# Patient Record
Sex: Female | Born: 1960 | Hispanic: No | Smoking: Former smoker
Health system: Southern US, Community
[De-identification: ages and names within clinical notes are randomized; demographics above are authoritative.]

## PROBLEM LIST (undated history)

## (undated) DIAGNOSIS — R4182 Altered mental status, unspecified: Secondary | ICD-10-CM

## (undated) DIAGNOSIS — F1010 Alcohol abuse, uncomplicated: Secondary | ICD-10-CM

## (undated) DIAGNOSIS — E109 Type 1 diabetes mellitus without complications: Secondary | ICD-10-CM

## (undated) DIAGNOSIS — R569 Unspecified convulsions: Secondary | ICD-10-CM

## (undated) DIAGNOSIS — F319 Bipolar disorder, unspecified: Secondary | ICD-10-CM

## (undated) DIAGNOSIS — E1110 Type 2 diabetes mellitus with ketoacidosis without coma: Secondary | ICD-10-CM

## (undated) DIAGNOSIS — D649 Anemia, unspecified: Secondary | ICD-10-CM

## (undated) DIAGNOSIS — N179 Acute kidney failure, unspecified: Secondary | ICD-10-CM

## (undated) DIAGNOSIS — Z5189 Encounter for other specified aftercare: Secondary | ICD-10-CM

## (undated) DIAGNOSIS — E1169 Type 2 diabetes mellitus with other specified complication: Secondary | ICD-10-CM

## (undated) DIAGNOSIS — I219 Acute myocardial infarction, unspecified: Secondary | ICD-10-CM

## (undated) HISTORY — PX: TONSILLECTOMY: SUR1361

## (undated) HISTORY — PX: ABDOMINAL HYSTERECTOMY: SHX81

---

## 2000-01-20 ENCOUNTER — Emergency Department (HOSPITAL_COMMUNITY): Admission: EM | Admit: 2000-01-20 | Discharge: 2000-01-20 | Payer: Self-pay | Admitting: Emergency Medicine

## 2000-01-20 ENCOUNTER — Encounter: Payer: Self-pay | Admitting: Emergency Medicine

## 2000-08-13 ENCOUNTER — Emergency Department (HOSPITAL_COMMUNITY): Admission: EM | Admit: 2000-08-13 | Discharge: 2000-08-14 | Payer: Self-pay | Admitting: Emergency Medicine

## 2000-08-16 ENCOUNTER — Emergency Department (HOSPITAL_COMMUNITY): Admission: EM | Admit: 2000-08-16 | Discharge: 2000-08-16 | Payer: Self-pay | Admitting: Emergency Medicine

## 2000-08-16 ENCOUNTER — Encounter: Payer: Self-pay | Admitting: Emergency Medicine

## 2000-09-01 ENCOUNTER — Encounter: Admission: RE | Admit: 2000-09-01 | Discharge: 2000-09-03 | Payer: Self-pay | Admitting: Neurological Surgery

## 2000-12-08 ENCOUNTER — Encounter: Admission: RE | Admit: 2000-12-08 | Discharge: 2001-03-08 | Payer: Self-pay | Admitting: Sports Medicine

## 2001-04-28 ENCOUNTER — Other Ambulatory Visit: Admission: RE | Admit: 2001-04-28 | Discharge: 2001-04-28 | Payer: Self-pay | Admitting: *Deleted

## 2001-05-18 ENCOUNTER — Encounter: Admission: RE | Admit: 2001-05-18 | Discharge: 2001-06-19 | Payer: Self-pay | Admitting: Sports Medicine

## 2015-01-02 DIAGNOSIS — E1169 Type 2 diabetes mellitus with other specified complication: Secondary | ICD-10-CM

## 2015-01-02 DIAGNOSIS — N179 Acute kidney failure, unspecified: Secondary | ICD-10-CM

## 2015-01-02 DIAGNOSIS — E1110 Type 2 diabetes mellitus with ketoacidosis without coma: Secondary | ICD-10-CM

## 2015-01-02 HISTORY — DX: Type 2 diabetes mellitus with other specified complication: E11.69

## 2015-01-02 HISTORY — DX: Type 2 diabetes mellitus with ketoacidosis without coma: E11.10

## 2015-01-02 HISTORY — DX: Acute kidney failure, unspecified: N17.9

## 2015-02-11 ENCOUNTER — Emergency Department (HOSPITAL_COMMUNITY): Payer: BLUE CROSS/BLUE SHIELD

## 2015-02-11 ENCOUNTER — Encounter (HOSPITAL_COMMUNITY): Payer: Self-pay

## 2015-02-11 ENCOUNTER — Inpatient Hospital Stay (HOSPITAL_COMMUNITY)
Admission: EM | Admit: 2015-02-11 | Discharge: 2015-02-16 | DRG: 637 | Disposition: A | Discharge status: Home / Self Care | Payer: BLUE CROSS/BLUE SHIELD | Attending: Internal Medicine | Admitting: Internal Medicine

## 2015-02-11 DIAGNOSIS — Z87891 Personal history of nicotine dependence: Secondary | ICD-10-CM | POA: Diagnosis not present

## 2015-02-11 DIAGNOSIS — I252 Old myocardial infarction: Secondary | ICD-10-CM

## 2015-02-11 DIAGNOSIS — F1021 Alcohol dependence, in remission: Secondary | ICD-10-CM | POA: Diagnosis present

## 2015-02-11 DIAGNOSIS — D631 Anemia in chronic kidney disease: Secondary | ICD-10-CM | POA: Diagnosis present

## 2015-02-11 DIAGNOSIS — E875 Hyperkalemia: Secondary | ICD-10-CM | POA: Diagnosis present

## 2015-02-11 DIAGNOSIS — F319 Bipolar disorder, unspecified: Secondary | ICD-10-CM | POA: Diagnosis present

## 2015-02-11 DIAGNOSIS — Z833 Family history of diabetes mellitus: Secondary | ICD-10-CM | POA: Diagnosis not present

## 2015-02-11 DIAGNOSIS — Z8701 Personal history of pneumonia (recurrent): Secondary | ICD-10-CM

## 2015-02-11 DIAGNOSIS — D649 Anemia, unspecified: Secondary | ICD-10-CM | POA: Diagnosis present

## 2015-02-11 DIAGNOSIS — E1110 Type 2 diabetes mellitus with ketoacidosis without coma: Secondary | ICD-10-CM | POA: Diagnosis present

## 2015-02-11 DIAGNOSIS — Z79899 Other long term (current) drug therapy: Secondary | ICD-10-CM

## 2015-02-11 DIAGNOSIS — F1011 Alcohol abuse, in remission: Secondary | ICD-10-CM | POA: Diagnosis present

## 2015-02-11 DIAGNOSIS — E871 Hypo-osmolality and hyponatremia: Secondary | ICD-10-CM | POA: Diagnosis present

## 2015-02-11 DIAGNOSIS — E876 Hypokalemia: Secondary | ICD-10-CM | POA: Diagnosis present

## 2015-02-11 DIAGNOSIS — Z794 Long term (current) use of insulin: Secondary | ICD-10-CM | POA: Diagnosis not present

## 2015-02-11 DIAGNOSIS — F1010 Alcohol abuse, uncomplicated: Secondary | ICD-10-CM | POA: Diagnosis not present

## 2015-02-11 DIAGNOSIS — E1040 Type 1 diabetes mellitus with diabetic neuropathy, unspecified: Secondary | ICD-10-CM | POA: Diagnosis present

## 2015-02-11 DIAGNOSIS — D638 Anemia in other chronic diseases classified elsewhere: Secondary | ICD-10-CM | POA: Diagnosis not present

## 2015-02-11 DIAGNOSIS — Z7982 Long term (current) use of aspirin: Secondary | ICD-10-CM | POA: Diagnosis not present

## 2015-02-11 DIAGNOSIS — N189 Chronic kidney disease, unspecified: Secondary | ICD-10-CM | POA: Diagnosis present

## 2015-02-11 DIAGNOSIS — Z8249 Family history of ischemic heart disease and other diseases of the circulatory system: Secondary | ICD-10-CM

## 2015-02-11 DIAGNOSIS — E1140 Type 2 diabetes mellitus with diabetic neuropathy, unspecified: Secondary | ICD-10-CM | POA: Diagnosis present

## 2015-02-11 DIAGNOSIS — N179 Acute kidney failure, unspecified: Secondary | ICD-10-CM | POA: Diagnosis present

## 2015-02-11 DIAGNOSIS — E1310 Other specified diabetes mellitus with ketoacidosis without coma: Secondary | ICD-10-CM | POA: Diagnosis not present

## 2015-02-11 DIAGNOSIS — E1022 Type 1 diabetes mellitus with diabetic chronic kidney disease: Secondary | ICD-10-CM | POA: Diagnosis present

## 2015-02-11 DIAGNOSIS — N170 Acute kidney failure with tubular necrosis: Secondary | ICD-10-CM | POA: Diagnosis present

## 2015-02-11 DIAGNOSIS — R109 Unspecified abdominal pain: Secondary | ICD-10-CM | POA: Diagnosis present

## 2015-02-11 DIAGNOSIS — L8990 Pressure ulcer of unspecified site, unspecified stage: Secondary | ICD-10-CM | POA: Diagnosis present

## 2015-02-11 DIAGNOSIS — J449 Chronic obstructive pulmonary disease, unspecified: Secondary | ICD-10-CM | POA: Diagnosis present

## 2015-02-11 DIAGNOSIS — E10649 Type 1 diabetes mellitus with hypoglycemia without coma: Secondary | ICD-10-CM | POA: Diagnosis present

## 2015-02-11 DIAGNOSIS — I129 Hypertensive chronic kidney disease with stage 1 through stage 4 chronic kidney disease, or unspecified chronic kidney disease: Secondary | ICD-10-CM | POA: Diagnosis present

## 2015-02-11 DIAGNOSIS — I10 Essential (primary) hypertension: Secondary | ICD-10-CM | POA: Diagnosis present

## 2015-02-11 DIAGNOSIS — E1010 Type 1 diabetes mellitus with ketoacidosis without coma: Secondary | ICD-10-CM | POA: Diagnosis present

## 2015-02-11 HISTORY — DX: Acute myocardial infarction, unspecified: I21.9

## 2015-02-11 HISTORY — DX: Encounter for other specified aftercare: Z51.89

## 2015-02-11 LAB — GLUCOSE, CAPILLARY
GLUCOSE-CAPILLARY: 342 mg/dL — AB (ref 65–99)
GLUCOSE-CAPILLARY: 155 mg/dL — AB (ref 65–99)
Glucose-Capillary: 293 mg/dL — ABNORMAL HIGH (ref 65–99)
Glucose-Capillary: 240 mg/dL — ABNORMAL HIGH (ref 65–99)

## 2015-02-11 LAB — COMPREHENSIVE METABOLIC PANEL
ALBUMIN: 3.6 g/dL (ref 3.5–5.0)
ALK PHOS: 122 U/L (ref 38–126)
ALT: 17 U/L (ref 14–54)
AST: 18 U/L (ref 15–41)
Anion gap: 26 — ABNORMAL HIGH (ref 5–15)
BUN: 39 mg/dL — ABNORMAL HIGH (ref 6–20)
CALCIUM: 9.2 mg/dL (ref 8.9–10.3)
CHLORIDE: 97 mmol/L — AB (ref 101–111)
CO2: 11 mmol/L — AB (ref 22–32)
CREATININE: 2.91 mg/dL — AB (ref 0.44–1.00)
GFR calc Af Amer: 20 mL/min — ABNORMAL LOW (ref >60)
GFR calc non Af Amer: 17 mL/min — ABNORMAL LOW (ref >60)
GLUCOSE: 763 mg/dL — AB (ref 65–99)
Potassium: 5.8 mmol/L — ABNORMAL HIGH (ref 3.5–5.1)
SODIUM: 134 mmol/L — AB (ref 135–145)
Total Bilirubin: 1.7 mg/dL — ABNORMAL HIGH (ref 0.3–1.2)
Total Protein: 7.7 g/dL (ref 6.5–8.1)

## 2015-02-11 LAB — CBC WITH DIFFERENTIAL/PLATELET
BASOS ABS: 0.1 10*3/uL (ref 0.0–0.1)
BASOS PCT: 1 % (ref 0–1)
EOS ABS: 0 10*3/uL (ref 0.0–0.7)
Eosinophils Relative: 0 % (ref 0–5)
HEMATOCRIT: 30.9 % — AB (ref 36.0–46.0)
HEMOGLOBIN: 9.6 g/dL — AB (ref 12.0–15.0)
Lymphocytes Relative: 23 % (ref 12–46)
Lymphs Abs: 1.9 10*3/uL (ref 0.7–4.0)
MCH: 30.4 pg (ref 26.0–34.0)
MCHC: 31.1 g/dL (ref 30.0–36.0)
MCV: 97.8 fL (ref 78.0–100.0)
Monocytes Absolute: 0.3 10*3/uL (ref 0.1–1.0)
Monocytes Relative: 3 % (ref 3–12)
NEUTROS ABS: 6.3 10*3/uL (ref 1.7–7.7)
NEUTROS PCT: 73 % (ref 43–77)
Platelets: 359 10*3/uL (ref 150–400)
RBC: 3.16 MIL/uL — AB (ref 3.87–5.11)
RDW: 13.5 % (ref 11.5–15.5)
WBC: 8.6 10*3/uL (ref 4.0–10.5)

## 2015-02-11 LAB — BLOOD GAS, VENOUS
Acid-base deficit: 15.5 mmol/L — ABNORMAL HIGH (ref 0.0–2.0)
BICARBONATE: 10.6 meq/L — AB (ref 20.0–24.0)
O2 Saturation: 62.6 %
PATIENT TEMPERATURE: 98.6
PO2 VEN: 39.6 mmHg (ref 30.0–45.0)
TCO2: 10.3 mmol/L (ref 0–100)
pCO2, Ven: 26.3 mmHg — ABNORMAL LOW (ref 45.0–50.0)
pH, Ven: 7.228 — ABNORMAL LOW (ref 7.250–7.300)

## 2015-02-11 LAB — MRSA PCR SCREENING: MRSA BY PCR: NEGATIVE

## 2015-02-11 LAB — URINALYSIS, ROUTINE W REFLEX MICROSCOPIC
BILIRUBIN URINE: NEGATIVE
HGB URINE DIPSTICK: NEGATIVE
Leukocytes, UA: NEGATIVE
Nitrite: NEGATIVE
PROTEIN: NEGATIVE mg/dL
Specific Gravity, Urine: 1.02 (ref 1.005–1.030)
UROBILINOGEN UA: 0.2 mg/dL (ref 0.0–1.0)
pH: 5 (ref 5.0–8.0)

## 2015-02-11 LAB — BASIC METABOLIC PANEL
ANION GAP: 13 (ref 5–15)
Anion gap: 17 — ABNORMAL HIGH (ref 5–15)
BUN: 34 mg/dL — ABNORMAL HIGH (ref 6–20)
BUN: 30 mg/dL — ABNORMAL HIGH (ref 6–20)
CALCIUM: 8.9 mg/dL (ref 8.9–10.3)
CALCIUM: 8.8 mg/dL — AB (ref 8.9–10.3)
CHLORIDE: 108 mmol/L (ref 101–111)
CO2: 17 mmol/L — AB (ref 22–32)
CO2: 18 mmol/L — AB (ref 22–32)
CREATININE: 2.63 mg/dL — AB (ref 0.44–1.00)
Chloride: 105 mmol/L (ref 101–111)
Creatinine, Ser: 2.21 mg/dL — ABNORMAL HIGH (ref 0.44–1.00)
GFR calc Af Amer: 23 mL/min — ABNORMAL LOW (ref >60)
GFR calc Af Amer: 28 mL/min — ABNORMAL LOW (ref >60)
GFR calc non Af Amer: 20 mL/min — ABNORMAL LOW (ref >60)
GFR calc non Af Amer: 24 mL/min — ABNORMAL LOW (ref >60)
GLUCOSE: 340 mg/dL — AB (ref 65–99)
GLUCOSE: 192 mg/dL — AB (ref 65–99)
Potassium: 4.6 mmol/L (ref 3.5–5.1)
Potassium: 3.9 mmol/L (ref 3.5–5.1)
Sodium: 139 mmol/L (ref 135–145)
Sodium: 139 mmol/L (ref 135–145)

## 2015-02-11 LAB — CBG MONITORING, ED
GLUCOSE-CAPILLARY: 583 mg/dL — AB (ref 65–99)
Glucose-Capillary: 448 mg/dL — ABNORMAL HIGH (ref 65–99)

## 2015-02-11 LAB — URINE MICROSCOPIC-ADD ON

## 2015-02-11 LAB — TROPONIN I: Troponin I: 0.05 ng/mL — ABNORMAL HIGH (ref <0.031)

## 2015-02-11 LAB — CK: CK TOTAL: 54 U/L (ref 38–234)

## 2015-02-11 LAB — VALPROIC ACID LEVEL: Valproic Acid Lvl: <10 ug/mL — ABNORMAL LOW (ref 50.0–100.0)

## 2015-02-11 MED ORDER — ACETAMINOPHEN 325 MG PO TABS
650.0000 mg | ORAL_TABLET | Freq: Four times a day (QID) | ORAL | Status: DC | PRN
Start: 2015-02-11 — End: 2015-02-16
  Administered 2015-02-11 – 2015-02-14 (×2): 650 mg via ORAL
  Filled 2015-02-11 (×2): qty 2

## 2015-02-11 MED ORDER — ONDANSETRON HCL 4 MG/2ML IJ SOLN
4.0000 mg | Freq: Once | INTRAMUSCULAR | Status: AC
  Administered 2015-02-11: 4 mg via INTRAVENOUS
  Filled 2015-02-11: qty 2

## 2015-02-11 MED ORDER — INSULIN ASPART 100 UNIT/ML ~~LOC~~ SOLN
14.0000 [IU] | Freq: Once | SUBCUTANEOUS | Status: AC
  Administered 2015-02-11: 14 [IU] via SUBCUTANEOUS
  Filled 2015-02-11: qty 1

## 2015-02-11 MED ORDER — SODIUM CHLORIDE 0.9 % IV SOLN
INTRAVENOUS | Status: DC
  Filled 2015-02-11: qty 2.5

## 2015-02-11 MED ORDER — QUETIAPINE FUMARATE 50 MG PO TABS
50.0000 mg | ORAL_TABLET | Freq: Every day | ORAL | Status: DC
  Administered 2015-02-11 – 2015-02-15 (×5): 50 mg via ORAL
  Filled 2015-02-11 (×5): qty 1

## 2015-02-11 MED ORDER — SODIUM CHLORIDE 0.9 % IV SOLN
INTRAVENOUS | Status: DC
  Administered 2015-02-11 – 2015-02-12 (×2): 1000 mL via INTRAVENOUS
  Administered 2015-02-13: 01:00:00 via INTRAVENOUS

## 2015-02-11 MED ORDER — HEPARIN SODIUM (PORCINE) 5000 UNIT/ML IJ SOLN
5000.0000 [IU] | Freq: Three times a day (TID) | INTRAMUSCULAR | Status: DC
  Administered 2015-02-11 – 2015-02-16 (×13): 5000 [IU] via SUBCUTANEOUS
  Filled 2015-02-11 (×16): qty 1

## 2015-02-11 MED ORDER — IRBESARTAN 75 MG PO TABS
75.0000 mg | ORAL_TABLET | Freq: Every day | ORAL | Status: DC
  Administered 2015-02-12 – 2015-02-15 (×4): 75 mg via ORAL
  Filled 2015-02-11 (×4): qty 1

## 2015-02-11 MED ORDER — DEXTROSE-NACL 5-0.45 % IV SOLN: INTRAVENOUS | Status: DC

## 2015-02-11 MED ORDER — ASPIRIN EC 81 MG PO TBEC
81.0000 mg | DELAYED_RELEASE_TABLET | Freq: Every day | ORAL | Status: DC
  Administered 2015-02-12 – 2015-02-16 (×5): 81 mg via ORAL
  Filled 2015-02-11 (×5): qty 1

## 2015-02-11 MED ORDER — ONDANSETRON HCL 4 MG/2ML IJ SOLN: 4.0000 mg | Freq: Four times a day (QID) | INTRAMUSCULAR | Status: DC | PRN

## 2015-02-11 MED ORDER — DIVALPROEX SODIUM ER 250 MG PO TB24
250.0000 mg | ORAL_TABLET | Freq: Two times a day (BID) | ORAL | Status: DC
  Administered 2015-02-11 – 2015-02-16 (×10): 250 mg via ORAL
  Filled 2015-02-11 (×11): qty 1

## 2015-02-11 MED ORDER — CARVEDILOL 6.25 MG PO TABS
6.2500 mg | ORAL_TABLET | Freq: Two times a day (BID) | ORAL | Status: DC
  Administered 2015-02-12 – 2015-02-16 (×9): 6.25 mg via ORAL
  Filled 2015-02-11 (×9): qty 1

## 2015-02-11 MED ORDER — SERTRALINE HCL 50 MG PO TABS
50.0000 mg | ORAL_TABLET | Freq: Every day | ORAL | Status: DC
  Administered 2015-02-11 – 2015-02-16 (×6): 50 mg via ORAL
  Filled 2015-02-11 (×6): qty 1

## 2015-02-11 MED ORDER — SODIUM CHLORIDE 0.9 % IV SOLN
1000.0000 mL | Freq: Once | INTRAVENOUS | Status: AC
  Administered 2015-02-11: 1000 mL via INTRAVENOUS

## 2015-02-11 MED ORDER — SODIUM CHLORIDE 0.9 % IV SOLN
1000.0000 mL | INTRAVENOUS | Status: DC
  Administered 2015-02-11: 1000 mL via INTRAVENOUS

## 2015-02-11 NOTE — ED Notes (Signed)
Pt brought in by EMS. Pts blood sugar >500 w/ EMS. Pt came from hospital in [place] where she spent 3 weeks there. Pt reports she was in hospital for kidney failure, heart attack, and DKA. Pt reports her insulin was changed in the hospital. Pt discharged from hospital yesterday and reports beginning to not feel well after her long car ride back. Pt reports eating dinner and having high blood sugar before going to bed. This morning pt reports eating a hard boiled egg and throwing it up. Pt reports not being able to control her blood sugar today, even after taking around 60 units of insulin.

## 2015-02-11 NOTE — ED Provider Notes (Signed)
CSN: [id number]     Arrival date & time 02/11/15  1238 History   First MD Initiated Contact with Patient 02/11/15 1246     Chief Complaint  Patient presents with  . Hyperglycemia      HPI Patient was recently hospitalized in [place] after an episode of diabetic ketoacidosis, diabetic coma, acute renal failure requiring short-term dialysis, non-ST elevation MI thought to be secondary to supply demand ischemia from her DKA per cardiology consultation note.  Patient was discharged from the hospital yesterday and was relocated to [facility] to live with her sister.  Her sister states that her blood sugars have been difficult to control on Levemir and Humalog since discharge from the hospital.  Today her blood sugar was unable to be read by her meter and she presented to the ER for evaluation.  She denies chest pain or shortness of breath.  She does report nausea and vomiting this morning.  She denies difficulty urinating.  She continues to make urine.  She denies chest pain or shortness of breath.  She was also hospitalized and found to have pneumonia and treated appropriately with antibiotics.  She denies new productive cough this time.  Previously she was a significant alcoholic however she has had no alcohol in the past 3 weeks since her hospitalization. Medications for patient since hospitalization were Depakote, Seroquel, Zoloft for mental instability.  Patient reports that she is tolerating those medicines well.  She's frustrated with her new insulin regimen and frustrated that he cannot control her blood sugars.  Her sister reports that she has always been able to manage her diabetes without significant difficulty.   Past Medical History  Diagnosis Date  . Blood transfusion without reported diagnosis   . Cancer   . COPD (chronic obstructive pulmonary disease)   . Heart attack 02/19/15   History reviewed. No pertinent past surgical history. No family history on file. Social History   Substance Use Topics  . Smoking status: Former Games developer  . Smokeless tobacco: None  . Alcohol Use: No   OB History    No data available     Review of Systems  All other systems reviewed and are negative.     Allergies  Review of patient's allergies indicates no known allergies.  Home Medications   Prior to Admission medications   Medication Sig Start Date End Date Taking? Authorizing Provider  aspirin EC 81 MG tablet Take 81 mg by mouth daily.   Yes Historical Provider, MD  carvedilol (COREG) 6.25 MG tablet Take 6.25 mg by mouth 2 (two) times daily with a meal.   Yes Historical Provider, MD  divalproex (DEPAKOTE ER) 250 MG 24 hr tablet Take 250 mg by mouth 2 (two) times daily.   Yes Historical Provider, MD  insulin detemir (LEVEMIR) 100 UNIT/ML injection Inject 10 Units into the skin at bedtime.   Yes Historical Provider, MD  insulin lispro (HUMALOG KWIKPEN) 100 UNIT/ML KiwkPen Inject 8 Units into the skin 3 (three) times daily.   Yes Historical Provider, MD  QUEtiapine (SEROQUEL) 50 MG tablet Take 50 mg by mouth at bedtime.   Yes Historical Provider, MD  sertraline (ZOLOFT) 50 MG tablet Take 50 mg by mouth daily.   Yes Historical Provider, MD  valsartan (DIOVAN) 160 MG tablet Take 160 mg by mouth daily.   Yes Historical Provider, MD   There were no vitals taken for this visit. Physical Exam  Constitutional: She is oriented to person, place, and time. She appears  well-developed and well-nourished. No distress.  HENT:  Head: Normocephalic and atraumatic.  Eyes: EOM are normal.  Neck: Normal range of motion.  Cardiovascular: Normal rate, regular rhythm and normal heart sounds.   Pulmonary/Chest: Effort normal and breath sounds normal.  Abdominal: Soft. She exhibits no distension. There is no tenderness.  Musculoskeletal: Normal range of motion.  Neurological: She is alert and oriented to person, place, and time.  Skin: Skin is warm and dry.  Psychiatric: She has a normal  mood and affect. Judgment normal.  Nursing note and vitals reviewed.   ED Course  Procedures (including critical care time)  CRITICAL CARE Performed by: Lyanne Co Total critical care time: 35 Critical care time was exclusive of separately billable procedures and treating other patients. Critical care was necessary to treat or prevent imminent or life-threatening deterioration. Critical care was time spent personally by me on the following activities: development of treatment plan with patient and/or surrogate as well as nursing, discussions with consultants, evaluation of patient's response to treatment, examination of patient, obtaining history from patient or surrogate, ordering and performing treatments and interventions, ordering and review of laboratory studies, ordering and review of radiographic studies, pulse oximetry and re-evaluation of patient's condition.   Labs Review Labs Reviewed  CBC WITH DIFFERENTIAL/PLATELET - Abnormal; Notable for the following:    RBC 3.16 (*)    Hemoglobin 9.6 (*)    HCT 30.9 (*)    All other components within normal limits  COMPREHENSIVE METABOLIC PANEL - Abnormal; Notable for the following:    Sodium 134 (*)    Potassium 5.8 (*)    Chloride 97 (*)    CO2 11 (*)    Glucose, Bld 763 (*)    BUN 39 (*)    Creatinine, Ser 2.91 (*)    Total Bilirubin 1.7 (*)    GFR calc non Af Amer 17 (*)    GFR calc Af Amer 20 (*)    Anion gap 26 (*)    All other components within normal limits  BLOOD GAS, VENOUS - Abnormal; Notable for the following:    pH, Ven 7.228 (*)    pCO2, Ven 26.3 (*)    Bicarbonate 10.6 (*)    Acid-base deficit 15.5 (*)    All other components within normal limits  VALPROIC ACID LEVEL - Abnormal; Notable for the following:    Valproic Acid Lvl <10 (*)    All other components within normal limits  TROPONIN I - Abnormal; Notable for the following:    Troponin I 0.05 (*)    All other components within normal limits   URINALYSIS, ROUTINE W REFLEX MICROSCOPIC (NOT AT [facility]) - Abnormal; Notable for the following:    Glucose, UA >1000 (*)    Ketones, ur >80 (*)    All other components within normal limits  BASIC METABOLIC PANEL - Abnormal; Notable for the following:    CO2 17 (*)    Glucose, Bld 340 (*)    BUN 34 (*)    Creatinine, Ser 2.63 (*)    GFR calc non Af Amer 20 (*)    GFR calc Af Amer 23 (*)    Anion gap 17 (*)    All other components within normal limits  BASIC METABOLIC PANEL - Abnormal; Notable for the following:    CO2 18 (*)    Glucose, Bld 192 (*)    BUN 30 (*)    Creatinine, Ser 2.21 (*)    Calcium 8.8 (*)  GFR calc non Af Amer 24 (*)    GFR calc Af Amer 28 (*)    All other components within normal limits  BASIC METABOLIC PANEL - Abnormal; Notable for the following:    CO2 20 (*)    BUN 29 (*)    Creatinine, Ser 2.07 (*)    Calcium 8.7 (*)    GFR calc non Af Amer 26 (*)    GFR calc Af Amer 31 (*)    All other components within normal limits  CBC - Abnormal; Notable for the following:    RBC 2.52 (*)    Hemoglobin 7.9 (*)    HCT 23.7 (*)    All other components within normal limits  HEPATIC FUNCTION PANEL - Abnormal; Notable for the following:    Total Protein 6.4 (*)    Albumin 2.9 (*)    AST 13 (*)    ALT 13 (*)    Bilirubin, Direct <0.1 (*)    All other components within normal limits  MAGNESIUM - Abnormal; Notable for the following:           GLUCOSE, CAPILLARY - Abnormal; Notable for the following:           GLUCOSE, CAPILLARY - Abnormal; Notable for the following:           GLUCOSE, CAPILLARY - Abnormal; Notable for the following:           GLUCOSE, CAPILLARY - Abnormal; Notable for the following:           GLUCOSE, CAPILLARY - Abnormal; Notable for the following:           GLUCOSE, CAPILLARY - Abnormal; Notable for the following:           GLUCOSE, CAPILLARY - Abnormal; Notable for the following:           CBG MONITORING, ED - Abnormal;  Notable for the following:           CBG MONITORING, ED - Abnormal; Notable for the following:           CBG MONITORING, ED - Abnormal; Notable for the following:           MRSA PCR SCREENING  CK  PHOSPHORUS  URINE MICROSCOPIC-ADD ON  GLUCOSE, CAPILLARY  GLUCOSE, CAPILLARY  GLUCOSE, CAPILLARY  BASIC METABOLIC PANEL  BASIC METABOLIC PANEL  BASIC METABOLIC PANEL  BASIC METABOLIC PANEL  BASIC METABOLIC PANEL    Imaging Review Dg Chest 2 View  02/11/2015   CLINICAL DATA:  Diabetic ketoacidosis.  EXAM: CHEST  2 VIEW  COMPARISON:  None.  FINDINGS: The heart size and mediastinal contours are within normal limits. Both lungs are clear. No pneumothorax or pleural effusion is noted. The visualized skeletal structures are unremarkable.  IMPRESSION: No active cardiopulmonary disease.   Electronically Signed   By: Lupita Raider, M.D.   On: 02/11/2015 16:27   I have personally reviewed and evaluated these images and lab results as part of my medical decision-making.   ECG interpretation  Date: 02/12/2015  Rate: 87  Rhythm: normal sinus rhythm  QRS Axis: normal  Intervals: normal  ST/T Wave abnormalities: normal  Conduction Disutrbances: none  Narrative Interpretation: artifact present  Old EKG Reviewed: no prior ecg available     MDM   Final diagnoses:  None    Patient presents in diabetic ketoacidosis with elevated anion gap, acidosis, hyperglycemia.  Patient be given IV insulin here in the emergency department and started on insulin drip.  Fluid resuscitation.  Her creatinine appears to be improves his baseline.  She continues to make good urine output.  No chest pain or shortness of breath.  Patient be admitted to the stepdown unit for ongoing aggressive management of her DKA.  02/09/2015- BUN/CR 32/4.1 (last hospital lab tests prior to discharge  Azalia Bilis, MD 02/12/15 [phone]

## 2015-02-11 NOTE — ED Notes (Signed)
Bed: NB39 Expected date: 02/11/15 Expected time: 12:34 PM Means of arrival: Ambulance Comments: hyperglycemia

## 2015-02-11 NOTE — ED Notes (Signed)
No other lab draw. Pt enroute to floor

## 2015-02-11 NOTE — ED Notes (Signed)
Attempted to call report, receiving nurse states she will call back in 5 minutes.

## 2015-02-11 NOTE — H&P (Signed)
History and Physical    Kimberly Pena:[id number] DOB: 11-02-1960 DOA: 02/11/2015  Referring physician: Dr. Patria Mane PCP: No primary care provider on file.  Specialists: none  Chief Complaint: abdominal pain,nausea / vomiting  HPI: Kimberly Pena is a 54 y.o. female who presents to the emergency room with chief complaint of persistently elevated sugars, abdominal pain, nausea and vomiting for the past 2 days. Patient had the recent history of a prolonged hospitalization in [place], she presented over there about 3 weeks ago after she went into DKA at home and laid on the floor for a long period of time, she had rhabdomyolysis with resultant renal failure requiring hemodialysis for a few sessions with apparent recovery of her renal function and did not become hemodialysis dependent, she also appeared to have had a troponin elevation likely thought to be due to demand ischemia rather than a true ACS. Cardiology was consulted over there, and she had a 2-D echo which apparently was normal. Patient's sister-in-law is in the room and provides a lot of the information. She was also diagnosed with pneumonia and finished antibiotics course over there. All her problems seem to have resolved, when she was discharged her creatinine was between 3 and 4, however her sister tells me that even prior to discharge her CBGs were never controlled and there were bouncing around between 2 and 300s. She was discharged from the hospital yesterday, and traveled to worsen [place] today, and continued to have elevated sugars despite using Levemir 10 units last night as well as sliding scale NovoLog today. She started feeling poorly with nausea vomiting and abdominal pain. Patient denies any diarrhea. She denies any fever or chills, she denies any cough, states that her respiratory symptoms have resolved, she denies any burning with urination or dysuria. She denies any chest pain. In the ED, she was found to be in  DKA with an elevated CBG of 763, bicarbonate of 11 and an anion gap of 26. She has renal failure with a BUN of 39 and creatinine of 2.9.   Review of Systems:  as per history of present illness, otherwise 10 point review of systems negative   Past Medical History  Diagnosis Date  . Blood transfusion without reported diagnosis   . Cancer   . COPD (chronic obstructive pulmonary disease)   . Heart attack 02/19/15   History reviewed. No pertinent past surgical history.   Social History:  reports that she has quit smoking. She does not have any smokeless tobacco history on file. She reports daily alcohol use up until her most recent hospitalization in [place], 6 beers per day   No Known Allergies  Reports family history of heart disease as well as diabetes   Prior to Admission medications   Medication Sig Start Date End Date Taking? Authorizing Provider  aspirin EC 81 MG tablet Take 81 mg by mouth daily.   Yes Historical Provider, MD  carvedilol (COREG) 6.25 MG tablet Take 6.25 mg by mouth 2 (two) times daily with a meal.   Yes Historical Provider, MD  divalproex (DEPAKOTE ER) 250 MG 24 hr tablet Take 250 mg by mouth 2 (two) times daily.   Yes Historical Provider, MD  insulin detemir (LEVEMIR) 100 UNIT/ML injection Inject 10 Units into the skin at bedtime.   Yes Historical Provider, MD  insulin lispro (HUMALOG KWIKPEN) 100 UNIT/ML KiwkPen Inject 8 Units into the skin 3 (three) times daily.   Yes Historical Provider, MD  QUEtiapine (SEROQUEL) 50 MG tablet Take 50 mg by mouth at bedtime.   Yes Historical Provider, MD  sertraline (ZOLOFT) 50 MG tablet Take 50 mg by mouth daily.   Yes Historical Provider, MD  valsartan (DIOVAN) 160 MG tablet Take 160 mg by mouth daily.   Yes Historical Provider, MD   Physical Exam: Filed Vitals:   02/11/15 1500  BP: 175/72  Pulse: 98  SpO2: 100%     GENERAL: NAD. Appears pale  HEENT: head NCAT, no scleral icterus. Pupils round and reactive.  Mucous membranes are dry.   NECK: Supple. No carotid bruits. No lymphadenopathy or thyromegaly.  LUNGS: Clear to auscultation. No wheezing or crackles  HEART: Regular rate and rhythm without murmur. 2+ pulses, no JVD, no peripheral edema  ABDOMEN: Soft, nontender, and nondistended. Positive bowel sounds.  EXTREMITIES: Without any cyanosis, clubbing, rash, lesions or edema.  NEUROLOGIC: Alert and oriented x3. Cranial nerves II through XII are grossly intact. Strength 5/5 in all 4.  PSYCHIATRIC: Normal mood and affect  SKIN: No ulceration or induration present.   Labs on Admission:  Basic Metabolic Panel:  Recent Labs Lab 02/11/15 1401  NA 134*  K 5.8*  CL 97*  CO2 11*  GLUCOSE 763*  BUN 39*  CREATININE 2.91*  CALCIUM 9.2   Liver Function Tests:  Recent Labs Lab 02/11/15 1401  AST 18  ALT 17  ALKPHOS 122  BILITOT 1.7*  PROT 7.7  ALBUMIN 3.6   CBC:  Recent Labs Lab 02/11/15 1401  WBC 8.6  NEUTROABS 6.3  HGB 9.6*  HCT 30.9*  MCV 97.8  PLT 359   CBG:  Recent Labs Lab 02/11/15 1300 02/11/15 1513  GLUCAP >600* 583*   Radiological Exams on Admission: Dg Chest 2 View  02/11/2015   CLINICAL DATA:  Diabetic ketoacidosis.  EXAM: CHEST  2 VIEW  COMPARISON:  None.  FINDINGS: The heart size and mediastinal contours are within normal limits. Both lungs are clear. No pneumothorax or pleural effusion is noted. The visualized skeletal structures are unremarkable.  IMPRESSION: No active cardiopulmonary disease.   Electronically Signed   By: Lupita Raider, M.D.   On: 02/11/2015 16:27   EKG: Independently reviewed. Sinus rhythm  Assessment/Plan Principal Problem:   DKA, type 1 Active Problems:   DKA (diabetic ketoacidoses)   AKI (acute kidney injury)   Alcohol abuse, in remission   Anemia   HTN (hypertension)   Hyperkalemia, transcellular shifts   Hyponatremia   DKA - She probably needs a little bit more insulin as an outpatient, she will be admitted  to step down, started on insulin drip up until the anion gap is closing - Nothing by mouth, IV fluids  - Obtain BMPs every 4 hours  - Will likely need either Levemir 10 units twice daily or Lantus 20 units daily once the gap is closing   Acute kidney injury - Patient without reported renal problems prior to being hospitalized in [place], she has rhabdomyolysis induced renal failure requiring hemodialysis, she seems to be recovering, she is making good urine - We'll closely monitor her renal function - It is too early to tell what her baseline creatinine will be and if she will have chronic kidney disease following the initial insult - will check CK  HTN - resume home medications  Hyperkalemia  - In the setting of DKA, monitor potassium   Anemia - Likely to related to acute recent illness  Pseudohyponatremia  Alcohol abuse, in remission -  Patient has not had anything to drink in the last 3 weeks, will not start CIWA  Probable bipolar disorder - discussed with her sister outside her room, patient apparently has a history of bipolar disorder however never formally diagnosed since she always refused evaluation. While hospitalized in [facility] she was started on Zoloft, Seroquel and Depakote with significant improvement in her mental stability.  - continue this regimen   Diet: NPO Fluids: NS DVT Prophylaxis: heparin  Code Status: Full  Family Communication: d/w sister in law bedside  Disposition Plan: admit to SDU   Costin M. Elvera Lennox, MD [facility] Pager [phone]  If 7PM-7AM, please contact night-coverage [email or web address] Password [facility] 02/11/2015, 4:23 PM

## 2015-02-12 LAB — BASIC METABOLIC PANEL
ANION GAP: 8 (ref 5–15)
ANION GAP: 8 (ref 5–15)
ANION GAP: 7 (ref 5–15)
ANION GAP: 7 (ref 5–15)
Anion gap: 12 (ref 5–15)
Anion gap: 9 (ref 5–15)
BUN: 29 mg/dL — AB (ref 6–20)
BUN: 26 mg/dL — AB (ref 6–20)
BUN: 22 mg/dL — ABNORMAL HIGH (ref 6–20)
BUN: 20 mg/dL (ref 6–20)
BUN: 22 mg/dL — ABNORMAL HIGH (ref 6–20)
BUN: 21 mg/dL — ABNORMAL HIGH (ref 6–20)
CALCIUM: 8.7 mg/dL — AB (ref 8.9–10.3)
CALCIUM: 8.6 mg/dL — AB (ref 8.9–10.3)
CALCIUM: 8.3 mg/dL — AB (ref 8.9–10.3)
CALCIUM: 8.5 mg/dL — AB (ref 8.9–10.3)
CHLORIDE: 109 mmol/L (ref 101–111)
CHLORIDE: 109 mmol/L (ref 101–111)
CHLORIDE: 110 mmol/L (ref 101–111)
CHLORIDE: 109 mmol/L (ref 101–111)
CHLORIDE: 113 mmol/L — AB (ref 101–111)
CO2: 20 mmol/L — ABNORMAL LOW (ref 22–32)
CO2: 22 mmol/L (ref 22–32)
CO2: 23 mmol/L (ref 22–32)
CO2: 21 mmol/L — AB (ref 22–32)
CO2: 23 mmol/L (ref 22–32)
CO2: 23 mmol/L (ref 22–32)
CREATININE: 2.07 mg/dL — AB (ref 0.44–1.00)
CREATININE: 2.07 mg/dL — AB (ref 0.44–1.00)
Calcium: 8.7 mg/dL — ABNORMAL LOW (ref 8.9–10.3)
Calcium: 8.4 mg/dL — ABNORMAL LOW (ref 8.9–10.3)
Chloride: 109 mmol/L (ref 101–111)
Creatinine, Ser: 1.82 mg/dL — ABNORMAL HIGH (ref 0.44–1.00)
Creatinine, Ser: 1.83 mg/dL — ABNORMAL HIGH (ref 0.44–1.00)
Creatinine, Ser: 1.86 mg/dL — ABNORMAL HIGH (ref 0.44–1.00)
Creatinine, Ser: 1.76 mg/dL — ABNORMAL HIGH (ref 0.44–1.00)
GFR calc Af Amer: 36 mL/min — ABNORMAL LOW (ref >60)
GFR calc non Af Amer: 31 mL/min — ABNORMAL LOW (ref >60)
GFR calc non Af Amer: 31 mL/min — ABNORMAL LOW (ref >60)
GFR calc non Af Amer: 30 mL/min — ABNORMAL LOW (ref >60)
GFR calc non Af Amer: 32 mL/min — ABNORMAL LOW (ref >60)
GFR, EST AFRICAN AMERICAN: 31 mL/min — AB (ref >60)
GFR, EST AFRICAN AMERICAN: 31 mL/min — AB (ref >60)
GFR, EST AFRICAN AMERICAN: 36 mL/min — AB (ref >60)
GFR, EST AFRICAN AMERICAN: 35 mL/min — AB (ref >60)
GFR, EST AFRICAN AMERICAN: 37 mL/min — AB (ref >60)
GFR, EST NON AFRICAN AMERICAN: 26 mL/min — AB (ref >60)
GFR, EST NON AFRICAN AMERICAN: 26 mL/min — AB (ref >60)
GLUCOSE: 86 mg/dL (ref 65–99)
GLUCOSE: 286 mg/dL — AB (ref 65–99)
GLUCOSE: 200 mg/dL — AB (ref 65–99)
GLUCOSE: 74 mg/dL (ref 65–99)
Glucose, Bld: 99 mg/dL (ref 65–99)
Glucose, Bld: 209 mg/dL — ABNORMAL HIGH (ref 65–99)
POTASSIUM: 3.8 mmol/L (ref 3.5–5.1)
POTASSIUM: 4.9 mmol/L (ref 3.5–5.1)
POTASSIUM: 4.2 mmol/L (ref 3.5–5.1)
POTASSIUM: 3.9 mmol/L (ref 3.5–5.1)
Potassium: 3.8 mmol/L (ref 3.5–5.1)
Potassium: 3.3 mmol/L — ABNORMAL LOW (ref 3.5–5.1)
SODIUM: 141 mmol/L (ref 135–145)
SODIUM: 140 mmol/L (ref 135–145)
Sodium: 140 mmol/L (ref 135–145)
Sodium: 139 mmol/L (ref 135–145)
Sodium: 139 mmol/L (ref 135–145)
Sodium: 143 mmol/L (ref 135–145)

## 2015-02-12 LAB — HEPATIC FUNCTION PANEL
ALBUMIN: 2.9 g/dL — AB (ref 3.5–5.0)
ALT: 13 U/L — AB (ref 14–54)
AST: 13 U/L — AB (ref 15–41)
Alkaline Phosphatase: 89 U/L (ref 38–126)
TOTAL PROTEIN: 6.4 g/dL — AB (ref 6.5–8.1)
Total Bilirubin: 0.6 mg/dL (ref 0.3–1.2)

## 2015-02-12 LAB — CBC
HCT: 23.7 % — ABNORMAL LOW (ref 36.0–46.0)
Hemoglobin: 7.9 g/dL — ABNORMAL LOW (ref 12.0–15.0)
MCH: 31.3 pg (ref 26.0–34.0)
MCHC: 33.3 g/dL (ref 30.0–36.0)
MCV: 94 fL (ref 78.0–100.0)
PLATELETS: 309 10*3/uL (ref 150–400)
RBC: 2.52 MIL/uL — AB (ref 3.87–5.11)
RDW: 13.7 % (ref 11.5–15.5)
WBC: 9.3 10*3/uL (ref 4.0–10.5)

## 2015-02-12 LAB — GLUCOSE, CAPILLARY
GLUCOSE-CAPILLARY: 125 mg/dL — AB (ref 65–99)
GLUCOSE-CAPILLARY: 191 mg/dL — AB (ref 65–99)
GLUCOSE-CAPILLARY: 221 mg/dL — AB (ref 65–99)
GLUCOSE-CAPILLARY: 228 mg/dL — AB (ref 65–99)
GLUCOSE-CAPILLARY: 74 mg/dL (ref 65–99)
GLUCOSE-CAPILLARY: 92 mg/dL (ref 65–99)
Glucose-Capillary: 164 mg/dL — ABNORMAL HIGH (ref 65–99)
Glucose-Capillary: 170 mg/dL — ABNORMAL HIGH (ref 65–99)
Glucose-Capillary: 277 mg/dL — ABNORMAL HIGH (ref 65–99)
Glucose-Capillary: 43 mg/dL — CL (ref 65–99)
Glucose-Capillary: 84 mg/dL (ref 65–99)
Glucose-Capillary: 85 mg/dL (ref 65–99)
Glucose-Capillary: 95 mg/dL (ref 65–99)
Glucose-Capillary: 98 mg/dL (ref 65–99)

## 2015-02-12 LAB — MAGNESIUM: Magnesium: 1.6 mg/dL — ABNORMAL LOW (ref 1.7–2.4)

## 2015-02-12 LAB — PHOSPHORUS: PHOSPHORUS: 2.5 mg/dL (ref 2.5–4.6)

## 2015-02-12 MED ORDER — LORAZEPAM 1 MG PO TABS
0.0000 mg | ORAL_TABLET | Freq: Two times a day (BID) | ORAL | Status: DC
Start: 2015-02-14 — End: 2015-02-15
  Filled 2015-02-12: qty 2

## 2015-02-12 MED ORDER — FOLIC ACID 1 MG PO TABS
1.0000 mg | ORAL_TABLET | Freq: Every day | ORAL | Status: DC
  Administered 2015-02-12 – 2015-02-16 (×5): 1 mg via ORAL
  Filled 2015-02-12 (×5): qty 1

## 2015-02-12 MED ORDER — INSULIN ASPART 100 UNIT/ML ~~LOC~~ SOLN: 0.0000 [IU] | Freq: Every day | SUBCUTANEOUS | Status: DC

## 2015-02-12 MED ORDER — VITAMIN B-1 100 MG PO TABS
100.0000 mg | ORAL_TABLET | Freq: Every day | ORAL | Status: DC
  Administered 2015-02-12 – 2015-02-16 (×5): 100 mg via ORAL
  Filled 2015-02-12 (×5): qty 1

## 2015-02-12 MED ORDER — AMLODIPINE BESYLATE 5 MG PO TABS
5.0000 mg | ORAL_TABLET | Freq: Every day | ORAL | Status: DC
  Administered 2015-02-12 – 2015-02-16 (×5): 5 mg via ORAL
  Filled 2015-02-12 (×5): qty 1

## 2015-02-12 MED ORDER — POTASSIUM CHLORIDE CRYS ER 20 MEQ PO TBCR
40.0000 meq | EXTENDED_RELEASE_TABLET | Freq: Once | ORAL | Status: AC
  Administered 2015-02-12: 40 meq via ORAL
  Filled 2015-02-12: qty 2

## 2015-02-12 MED ORDER — LORAZEPAM 1 MG PO TABS
0.0000 mg | ORAL_TABLET | Freq: Four times a day (QID) | ORAL | Status: AC
Start: 2015-02-12 — End: 2015-02-14
  Administered 2015-02-12: 2 mg via ORAL
  Filled 2015-02-12: qty 2

## 2015-02-12 MED ORDER — LORAZEPAM 1 MG PO TABS: 1.0000 mg | ORAL_TABLET | Freq: Four times a day (QID) | ORAL | Status: DC | PRN

## 2015-02-12 MED ORDER — ADULT MULTIVITAMIN W/MINERALS CH
1.0000 | ORAL_TABLET | Freq: Every day | ORAL | Status: DC
  Administered 2015-02-12 – 2015-02-16 (×5): 1 via ORAL
  Filled 2015-02-12 (×5): qty 1

## 2015-02-12 MED ORDER — INSULIN ASPART 100 UNIT/ML ~~LOC~~ SOLN
4.0000 [IU] | Freq: Three times a day (TID) | SUBCUTANEOUS | Status: DC
  Administered 2015-02-12: 4 [IU] via SUBCUTANEOUS

## 2015-02-12 MED ORDER — LORAZEPAM 2 MG/ML IJ SOLN: 1.0000 mg | Freq: Four times a day (QID) | INTRAMUSCULAR | Status: DC | PRN

## 2015-02-12 MED ORDER — THIAMINE HCL 100 MG/ML IJ SOLN
100.0000 mg | Freq: Every day | INTRAMUSCULAR | Status: DC
  Filled 2015-02-12 (×2): qty 1

## 2015-02-12 MED ORDER — INSULIN DETEMIR 100 UNIT/ML ~~LOC~~ SOLN
10.0000 [IU] | Freq: Two times a day (BID) | SUBCUTANEOUS | Status: DC
  Administered 2015-02-12 (×2): 10 [IU] via SUBCUTANEOUS
  Filled 2015-02-12 (×3): qty 0.1

## 2015-02-12 MED ORDER — INSULIN ASPART 100 UNIT/ML ~~LOC~~ SOLN
0.0000 [IU] | Freq: Three times a day (TID) | SUBCUTANEOUS | Status: DC
  Administered 2015-02-12: 8 [IU] via SUBCUTANEOUS

## 2015-02-12 NOTE — Progress Notes (Signed)
Hypoglycemic Event  CBG: 61  Treatment: 15 GM carbohydrate snack  Symptoms: None  Follow-up CBG: Time:2208 CBG Result:92  Possible Reasons for Event: Unknown  Comments/MD notified: Schorr notified    Kimberly Pena  Remember to initiate Hypoglycemia Order Set & complete

## 2015-02-12 NOTE — Progress Notes (Signed)
Late entry! Pt alert x3, started to get very restless and agitated . Orders received for 2 mg Po Ativan will continue to monitor. Estill Dooms, RN 7:04 AM 02/12/2015

## 2015-02-12 NOTE — Progress Notes (Signed)
PROGRESS NOTE  Kimberly Pena ZOX:[id number] DOB: 1960-12-04 DOA: 02/11/2015 PCP: No primary care provider on file.   HPI: 54 yo F admitted on 9/10 with DKA. She is s/p 2/5 weeks complicated stay at a hospital in [facility] where she had DKA, rhabdomyolysis induced renal failure requiring temporary HD.  Subjective / 24 H Interval events - feeling better this morning, slept well overnight  Assessment/Plan: Principal Problem:   DKA, type 1 Active Problems:   DKA (diabetic ketoacidoses)   AKI (acute kidney injury)   Alcohol abuse, in remission   Anemia   HTN (hypertension)   Hyperkalemia, transcellular shifts   Hyponatremia   Pressure ulcer    DKA - continue insulin gtt this morning, gap is closed, await CBGs within range per protocol to start long acting insulin and SSI - allow diet as soon as comes off drip - most recent bicarb 22  Acute kidney injury - Patient without reported renal problems prior to being hospitalized in [place], she has rhabdomyolysis induced renal failure requiring hemodialysis, she seems to be recovering, she is making good urine - renal function stable - It is too early to tell what her baseline creatinine will be and if she will have chronic kidney disease following the initial insult - CK normal at 54  HTN - resume home medications  Anemia - Likely to related to acute recent illness - Hb lower this morning, no bleeding, likely dilutional   Pseudohyponatremia - resolved  Alcohol abuse, in remission - Patient has not had anything to drink in the last 3 weeks - per night team it appeared that she is withdrawing, started CIWA  Probable bipolar disorder - discussed with her sister outside her room on admission, patient apparently has a history of bipolar disorder however never formally diagnosed since she always refused evaluation. While hospitalized in [facility] she was started on Zoloft, Seroquel and Depakote with significant improvement in her  mental stability.  - continue this regimen   Diet:   Fluids: NS DVT Prophylaxis: hepatin  Code Status: Full Code Family Communication: no family bedside  Disposition Plan: remain in SDU  Consultants:  None   Procedures:  None    Antibiotics  Anti-infectives    None       Studies  Dg Chest 2 View  02/11/2015   CLINICAL DATA:  Diabetic ketoacidosis.  EXAM: CHEST  2 VIEW  COMPARISON:  None.  FINDINGS: The heart size and mediastinal contours are within normal limits. Both lungs are clear. No pneumothorax or pleural effusion is noted. The visualized skeletal structures are unremarkable.  IMPRESSION: No active cardiopulmonary disease.   Electronically Signed   By: Lupita Raider, M.D.   On: 02/11/2015 16:27    Objective  Filed Vitals:   02/12/15 0400 02/12/15 0600 02/12/15 0700 02/12/15 0800  BP: 177/73 158/68 158/70   Pulse: 94 80 90   Temp:    98.6 F (37 C)  TempSrc:    Oral  Resp: Height:      Weight:      SpO2: 96% 98% 95%     Intake/Output Summary (Last 24 hours) at 02/12/15 0952 Last data filed at 02/12/15 0529  Gross per 24 hour  Intake 133.85 ml  Output    600 ml  Net -466.15 ml   Filed Weights   02/11/15 1800  Weight: 65.1 kg (143 lb 8.3 oz)   Exam:  GENERAL: NAD, appears pale  HEENT: head  NCAT, no scleral icterus.   LUNGS: Clear to auscultation. No wheezing or crackles  HEART: Regular rate and rhythm without murmur. 2+ pulses, no JVD, no peripheral edema  ABDOMEN: Soft, nontender, and nondistended.   EXTREMITIES: Without any cyanosis, clubbing, rash, lesions or edema.  NEUROLOGIC: Alert and oriented x3, non focal   Data Reviewed: Basic Metabolic Panel:  Recent Labs Lab 02/11/15 1401 02/11/15 1810 02/11/15 2215 02/12/15 0158 02/12/15 0621  NA 134* 139 139 141 140  K 5.8* 4.6 3.9 3.8 3.8  CL 97* 105 108 109 109  CO2 11* 17* 18* 20* 22  GLUCOSE 763* 340* 192* 99 209*  BUN 39* 34* 30* 29* 26*  CREATININE 2.91*  2.63* 2.21* 2.07* 2.07*  CALCIUM 9.2 8.9 8.8* 8.7* 8.7*  MG  --   --   --  1.6*  --   PHOS  --   --   --  2.5  --    Liver Function Tests:  Recent Labs Lab 02/11/15 1401 02/12/15 0158  AST 18 13*  ALT 17 13*  ALKPHOS 122 89  BILITOT 1.7* 0.6  PROT 7.7 6.4*  ALBUMIN 3.6 2.9*   CBC:  Recent Labs Lab 02/11/15 1401 02/12/15 0158  WBC 8.6 9.3  NEUTROABS 6.3  --   HGB 9.6* 7.9*  HCT 30.9* 23.7*  MCV 97.8 94.0  PLT 359 309   Cardiac Enzymes:  Recent Labs Lab 02/11/15 1810  CKTOTAL 54  TROPONINI 0.05*   CBG:  Recent Labs Lab 02/12/15 0050 02/12/15 0152 02/12/15 0254 02/12/15 0416 02/12/15 0526  GLUCAP 74 98 170* 228* 221*    Recent Results (from the past 240 hour(s))  MRSA PCR Screening     Status: None   Collection Time: 02/11/15  6:00 PM  Result Value Ref Range Status   MRSA by PCR NEGATIVE NEGATIVE Final    Comment:        The GeneXpert MRSA Assay (FDA approved for NASAL specimens only), is one component of a comprehensive MRSA colonization surveillance program. It is not intended to diagnose MRSA infection nor to guide or monitor treatment for MRSA infections.      Scheduled Meds: . aspirin EC  81 mg Oral Daily  . carvedilol  6.25 mg Oral BID WC  . divalproex  250 mg Oral BID  . folic acid  1 mg Oral Daily  . heparin  5,000 Units Subcutaneous 3 times per day  . irbesartan  75 mg Oral Daily  . LORazepam  0-4 mg Oral Q6H   Followed by  . [START ON 02/14/2015] LORazepam  0-4 mg Oral Q12H  . multivitamin with minerals  1 tablet Oral Daily  . QUEtiapine  50 mg Oral QHS  . sertraline  50 mg Oral Daily  . thiamine  100 mg Oral Daily   Or  . thiamine  100 mg Intravenous Daily   Continuous Infusions: . sodium chloride 1,000 mL (02/11/15 1805)  . dextrose 5 % and 0.45% NaCl    . insulin (NOVOLIN-R) infusion 1.3 Units/hr (02/12/15 0901)    Pamella Pert, MD [facility] Pager [phone]. If 7 PM - 7 AM, please contact night-coverage  at [email or web address], password [facility] 02/12/2015, 9:52 AM  LOS: 1 day

## 2015-02-12 NOTE — Progress Notes (Signed)
Hypoglycemic Event  CBG: 43  Treatment: 15 GM carbohydrate snack  Symptoms: None  Follow-up CBG: Time:2130 CBG Result:61  Possible Reasons for Event: Unknown  Comments/MD notified: Schorr notified; juice given    Doyne Keel H  Remember to initiate Hypoglycemia Order Set & complete

## 2015-02-13 LAB — GLUCOSE, CAPILLARY
GLUCOSE-CAPILLARY: 42 mg/dL — AB (ref 65–99)
GLUCOSE-CAPILLARY: 94 mg/dL (ref 65–99)
GLUCOSE-CAPILLARY: 31 mg/dL — AB (ref 65–99)
GLUCOSE-CAPILLARY: 215 mg/dL — AB (ref 65–99)
GLUCOSE-CAPILLARY: 74 mg/dL (ref 65–99)
Glucose-Capillary: 386 mg/dL — ABNORMAL HIGH (ref 65–99)
Glucose-Capillary: 136 mg/dL — ABNORMAL HIGH (ref 65–99)
Glucose-Capillary: 163 mg/dL — ABNORMAL HIGH (ref 65–99)

## 2015-02-13 MED ORDER — DEXTROSE 50 % IV SOLN
INTRAVENOUS | Status: AC
  Filled 2015-02-13: qty 50

## 2015-02-13 MED ORDER — INSULIN DETEMIR 100 UNIT/ML ~~LOC~~ SOLN
7.0000 [IU] | Freq: Two times a day (BID) | SUBCUTANEOUS | Status: DC
  Administered 2015-02-13 – 2015-02-14 (×3): 7 [IU] via SUBCUTANEOUS
  Filled 2015-02-13 (×4): qty 0.07

## 2015-02-13 MED ORDER — INSULIN ASPART 100 UNIT/ML ~~LOC~~ SOLN
0.0000 [IU] | Freq: Three times a day (TID) | SUBCUTANEOUS | Status: DC
  Administered 2015-02-13: 2 [IU] via SUBCUTANEOUS
  Administered 2015-02-13: 3 [IU] via SUBCUTANEOUS
  Administered 2015-02-14: 2 [IU] via SUBCUTANEOUS
  Administered 2015-02-14: 3 [IU] via SUBCUTANEOUS
  Administered 2015-02-15: 1 [IU] via SUBCUTANEOUS
  Administered 2015-02-15: 7 [IU] via SUBCUTANEOUS
  Administered 2015-02-15: 5 [IU] via SUBCUTANEOUS
  Administered 2015-02-16: 1 [IU] via SUBCUTANEOUS
  Administered 2015-02-16: 9 [IU] via SUBCUTANEOUS

## 2015-02-13 MED ORDER — INSULIN ASPART 100 UNIT/ML ~~LOC~~ SOLN: 0.0000 [IU] | Freq: Every day | SUBCUTANEOUS | Status: DC

## 2015-02-13 MED ORDER — INSULIN ASPART 100 UNIT/ML ~~LOC~~ SOLN
2.0000 [IU] | Freq: Three times a day (TID) | SUBCUTANEOUS | Status: DC
  Administered 2015-02-13 – 2015-02-16 (×9): 2 [IU] via SUBCUTANEOUS

## 2015-02-13 NOTE — Progress Notes (Signed)
PROGRESS NOTE  Kimberly Pena WUJ:[id number] DOB: [birth year] DOA: 02/11/2015 PCP: No primary care provider on file.   HPI: 54 yo F admitted on 9/10 with DKA. She is s/p 2/5 weeks complicated stay at a hospital in [facility] where she had DKA, rhabdomyolysis induced renal failure requiring temporary HD.  Subjective / 24 H Interval events - no complaints this morning - hypoglycemic overnight without awareness. Apparently she was asymptomatic with CBG of 31.   Assessment/Plan: Principal Problem:   DKA, type 1 Active Problems:   DKA (diabetic ketoacidoses)   AKI (acute kidney injury)   Alcohol abuse, in remission   Anemia   HTN (hypertension)   Hyperkalemia, transcellular shifts   Hyponatremia   Pressure ulcer   IDDM - appears to be quite brittle and without hypoglycemia awarness - further adjust her levemir dose today given hypoglycemia and scheduled mealtime regimen - continue to monitor - A1C pending  DKA - resolved, gap closed, now on Levemir BID and SSI - transfer to floor  Acute kidney injury - Patient without reported renal problems prior to being hospitalized in [place], she has rhabdomyolysis induced renal failure requiring hemodialysis, she seems to be recovering, she is making good urine - renal function stable, improving - It is too early to tell what her baseline creatinine will be and if she will have chronic kidney disease following the initial insult  HTN - resume home medications  Anemia - Likely to related to acute recent illness - repeat CBC in am  Pseudohyponatremia - resolved  Alcohol abuse, in remission - Patient has not had anything to drink in the last 3 weeks  Probable bipolar disorder - discussed with her sister outside her room on admission, patient apparently has a history of bipolar disorder however never formally diagnosed since she always refused evaluation. While hospitalized in [facility] she was started on Zoloft, Seroquel and Depakote  with significant improvement in her mental stability.  - continue this regimen   Diet: Diet Carb Modified Fluid consistency:: Thin; Room service appropriate?: Yes Fluids: NS DVT Prophylaxis: hepatin  Code Status: Full Code Family Communication: no family bedside  Disposition Plan: remain in SDU  Consultants:  None   Procedures:  None    Antibiotics  Anti-infectives    None       Studies  Dg Chest 2 View  02/11/2015   CLINICAL DATA:  Diabetic ketoacidosis.  EXAM: CHEST  2 VIEW  COMPARISON:  None.  FINDINGS: The heart size and mediastinal contours are within normal limits. Both lungs are clear. No pneumothorax or pleural effusion is noted. The visualized skeletal structures are unremarkable.  IMPRESSION: No active cardiopulmonary disease.   Electronically Signed   By: Lupita Raider, M.D.   On: 02/11/2015 16:27    Objective  Filed Vitals:   02/13/15 0600 02/13/15 0700 02/13/15 0800 02/13/15 1300  BP: 143/48 151/67 177/73   Pulse: 69 67 66   Temp:   98 F (36.7 C) 98.4 F (36.9 C)  TempSrc:   Oral Oral  Resp: Height:      Weight:      SpO2: 97% 96% 97%     Intake/Output Summary (Last 24 hours) at 02/13/15 1455 Last data filed at 02/13/15 0800  Gross per 24 hour  Intake   1275 ml  Output   1900 ml  Net   -625 ml   Filed Weights   02/11/15 Kimberly DONALDSONt: 65.1 kg (143 lb  8.3 oz)   Exam:  GENERAL: NAD, appears pale  HEENT: head NCAT, no scleral icterus.   LUNGS: Clear to auscultation. No wheezing or crackles  HEART: Regular rate and rhythm without murmur. 2+ pulses, no JVD, no peripheral edema  ABDOMEN: Soft, nontender, and nondistended.   EXTREMITIES: Without any cyanosis, clubbing, rash, lesions or edema.  NEUROLOGIC: Alert and oriented x3, non focal   Data Reviewed: Basic Metabolic Panel:  Recent Labs Lab 02/12/15 0158 02/12/15 0621 02/12/15 1017 02/12/15 1452 02/12/15 1815 02/12/15 2200  NA 141 140 140 139 139 143  K  3.8 3.8 3.3* 4.9 4.2 3.9  CL 109 109 109 110 109 113*  CO2 20* 22 23 21* 23 23  GLUCOSE 99 209* 86 286* 200* 74  BUN 29* 26* 22* 20 22* 21*  CREATININE 2.07* 2.07* 1.82* 1.83* 1.86* 1.76*  CALCIUM 8.7* 8.7* 8.6* 8.3* 8.4* 8.5*  MG 1.6*  --   --   --   --   --   PHOS 2.5  --   --   --   --   --    Liver Function Tests:  Recent Labs Lab 02/11/15 1401 02/12/15 0158  AST 18 13*  ALT 17 13*  ALKPHOS 122 89  BILITOT 1.7* 0.6  PROT 7.7 6.4*  ALBUMIN 3.6 2.9*   CBC:  Recent Labs Lab 02/11/15 1401 02/12/15 0158  WBC 8.6 9.3  NEUTROABS 6.3  --   HGB 9.6* 7.9*  HCT 30.9* 23.7*  MCV 97.8 94.0  PLT 359 309   Cardiac Enzymes:  Recent Labs Lab 02/11/15 1810  CKTOTAL 54  TROPONINI 0.05*   CBG:  Recent Labs Lab 02/13/15 0418 02/13/15 0509 02/13/15 0758 02/13/15 0952 02/13/15 1313  GLUCAP 42* 94 31* 136* 163*    Recent Results (from the past 240 hour(s))  MRSA PCR Screening     Status: None   Collection Time: 02/11/15  6:00 PM  Result Value Ref Range Status   MRSA by PCR NEGATIVE NEGATIVE Final    Comment:        The GeneXpert MRSA Assay (FDA approved for NASAL specimens only), is one component of a comprehensive MRSA colonization surveillance program. It is not intended to diagnose MRSA infection nor to guide or monitor treatment for MRSA infections.    Scheduled Meds: . amLODipine  5 mg Oral Daily  . aspirin EC  81 mg Oral Daily  . carvedilol  6.25 mg Oral BID WC  . divalproex  250 mg Oral BID  . folic acid  1 mg Oral Daily  . heparin  5,000 Units Subcutaneous 3 times per day  . insulin aspart  0-5 Units Subcutaneous QHS  . insulin aspart  0-9 Units Subcutaneous TID WC  . insulin aspart  2 Units Subcutaneous TID WC  . insulin detemir  7 Units Subcutaneous BID  . irbesartan  75 mg Oral Daily  . LORazepam  0-4 mg Oral Q6H   Followed by  . [START ON 02/14/2015] LORazepam  0-4 mg Oral Q12H  . multivitamin with minerals  1 tablet Oral Daily  .  QUEtiapine  50 mg Oral QHS  . sertraline  50 mg Oral Daily  . thiamine  100 mg Oral Daily   Or  . thiamine  100 mg Intravenous Daily   Continuous Infusions: . sodium chloride 75 mL/hr at 02/13/15 0700   Pamella Pert, MD [facility] Pager [phone]. If 7 PM - 7 AM, please contact night-coverage at [email or web address],  password [email or web address] 02/13/2015, 2:55 PM  LOS: 2 days

## 2015-02-13 NOTE — Progress Notes (Signed)
Hypoglycemic Event  CBG: 42  Treatment: 15 GM carbohydrate snack  Symptoms: None  Follow-up CBG: Time:0500 CBG Result:94  Possible Reasons for Event: Unknown  Comments/MD notified:Schorr notified    Kimberly Pena  Remember to initiate Hypoglycemia Order Set & complete

## 2015-02-13 NOTE — Progress Notes (Signed)
Received from ICU.A&Ox4 skin WD voices no C/O

## 2015-02-13 NOTE — Progress Notes (Signed)
Inpatient Diabetes Program Recommendations  AACE/ADA: New Consensus Statement on Inpatient Glycemic Control (2013)  Target Ranges:  Prepandial:   less than 140 mg/dL      Peak postprandial:   less than 180 mg/dL (1-2 hours)      Critically ill patients:  140 - 180 mg/dL   Reason for Visit: Diabetes Consult  Diabetes history: DM1 Outpatient Diabetes medications: Previously on NPH 30 units QAM and Regular insulin at meals (5-10 units) Was discharged from [facility] hospital with Levemir 10 units QHS and Humalog 8 units tidwc Current orders for Inpatient glycemic control: Levemir 7 units bid and Novolog sensitive tidwc and hs + 2 units tidwc. HgbA1C pending. Pt states her HgbA1C usually runs around 4%.   Results for Kimberly Pena, Kimberly Pena (MRN [id number]) as of 02/13/2015 16:41  Ref. Range 02/13/2015 05:09 02/13/2015 07:58 02/13/2015 09:52 02/13/2015 13:13 02/13/2015 16:23  Glucose-Capillary Latest Ref Range: 65-99 mg/dL 94 31 (LL) 136 (H) 163 (H) 215 (H)  Results for Kimberly Pena, Kimberly Pena (MRN [id number]) as of 02/13/2015 16:41  Ref. Range 02/12/2015 22:00  Sodium Latest Ref Range: 135-145 mmol/L 143  Potassium Latest Ref Range: 3.5-5.1 mmol/L 3.9  Chloride Latest Ref Range: 101-111 mmol/L 113 (H)  CO2 Latest Ref Range: 22-32 mmol/L 23  BUN Latest Ref Range: 6-20 mg/dL 21 (H)  Creatinine Latest Ref Range: 0.44-1.00 mg/dL 1.76 (H)  Calcium Latest Ref Range: 8.9-10.3 mg/dL 8.5 (L)  EGFR (Non-African Amer.) Latest Ref Range: >60 mL/min 32 (L)  EGFR (African American) Latest Ref Range: >60 mL/min 37 (L)  Glucose Latest Ref Range: 65-99 mg/dL 74  Anion gap Latest Ref Range: 5-15  7   DKA resolved and gap is closed at 7. Transitioned to Levemir and Novolog. Hypoglycemia this am - no awareness.  Agree with Levemir 7 units bid.Will likely need meal coverage titration.  Lengthy conversation with pt's sister-in-law regarding her diabetes care. Sister-in-law states pt will not be able to afford her insurance premiums and  currently applying for Disability. States Levemir and Humalog were approximately $250 each, even with BCBS.  Will need to be discharged on affordable insulin. May consider Novolin 70/30 with Regular s/s. Begin with 70/30 10 units bid and titrate. Has meter but will need prescription for strips and lancets.  Appt to meet with Sister-in-law on Wed., Sept 14, at 9:30 am to further discuss diabetes management.  Will f/u in am with HgbA1C and blood sugars.  Case manager consult for obtaining MD to manage DM.  Thank you. Lorenda Peck, RD, LDN, CDE Inpatient Diabetes Coordinator [phone]

## 2015-02-14 LAB — GLUCOSE, CAPILLARY
GLUCOSE-CAPILLARY: 163 mg/dL — AB (ref 65–99)
GLUCOSE-CAPILLARY: 155 mg/dL — AB (ref 65–99)
Glucose-Capillary: 229 mg/dL — ABNORMAL HIGH (ref 65–99)
Glucose-Capillary: 117 mg/dL — ABNORMAL HIGH (ref 65–99)

## 2015-02-14 LAB — BASIC METABOLIC PANEL
Anion gap: 9 (ref 5–15)
BUN: 15 mg/dL (ref 6–20)
CALCIUM: 8.4 mg/dL — AB (ref 8.9–10.3)
CHLORIDE: 108 mmol/L (ref 101–111)
CO2: 25 mmol/L (ref 22–32)
CREATININE: 1.39 mg/dL — AB (ref 0.44–1.00)
GFR calc Af Amer: 50 mL/min — ABNORMAL LOW (ref >60)
GFR calc non Af Amer: 43 mL/min — ABNORMAL LOW (ref >60)
GLUCOSE: 228 mg/dL — AB (ref 65–99)
Potassium: 3.8 mmol/L (ref 3.5–5.1)
Sodium: 142 mmol/L (ref 135–145)

## 2015-02-14 LAB — CBC
HEMATOCRIT: 27.2 % — AB (ref 36.0–46.0)
HEMOGLOBIN: 8.9 g/dL — AB (ref 12.0–15.0)
MCH: 30.5 pg (ref 26.0–34.0)
MCHC: 32.7 g/dL (ref 30.0–36.0)
MCV: 93.2 fL (ref 78.0–100.0)
Platelets: 328 10*3/uL (ref 150–400)
RBC: 2.92 MIL/uL — ABNORMAL LOW (ref 3.87–5.11)
RDW: 13.9 % (ref 11.5–15.5)
WBC: 6.7 10*3/uL (ref 4.0–10.5)

## 2015-02-14 MED ORDER — INSULIN NPH (HUMAN) (ISOPHANE) 100 UNIT/ML ~~LOC~~ SUSP
7.0000 [IU] | Freq: Two times a day (BID) | SUBCUTANEOUS | Status: DC
  Administered 2015-02-14 – 2015-02-16 (×3): 7 [IU] via SUBCUTANEOUS
  Filled 2015-02-14: qty 10

## 2015-02-14 NOTE — Progress Notes (Signed)
Inpatient Diabetes Program Recommendations  AACE/ADA: New Consensus Statement on Inpatient Glycemic Control (2013)  Target Ranges:  Prepandial:   less than 140 mg/dL      Peak postprandial:   less than 180 mg/dL (1-2 hours)      Critically ill patients:  140 - 180 mg/dL   Results for STARLINA, LAPRE (MRN [id number]) as of 02/14/2015 10:32  Ref. Range 02/13/2015 16:23 02/13/2015 22:24 02/14/2015 07:30  Glucose-Capillary Latest Ref Range: 65-99 mg/dL 215 (H) 74 229 (H)   Results for ALDEA, AVIS (MRN [id number]) as of 02/14/2015 10:32  Ref. Range 02/14/2015 05:37  Sodium Latest Ref Range: 135-145 mmol/L 142  Potassium Latest Ref Range: 3.5-5.1 mmol/L 3.8  Chloride Latest Ref Range: 101-111 mmol/L 108  CO2 Latest Ref Range: 22-32 mmol/L 25  BUN Latest Ref Range: 6-20 mg/dL 15  Creatinine Latest Ref Range: 0.44-1.00 mg/dL 1.39 (H)  Calcium Latest Ref Range: 8.9-10.3 mg/dL 8.4 (L)  EGFR (Non-African Amer.) Latest Ref Range: >60 mL/min 43 (L)  EGFR (African American) Latest Ref Range: >60 mL/min 50 (L)  Glucose Latest Ref Range: 65-99 mg/dL 228 (H)  Anion gap Latest Ref Range: 5-15  9   Will need to switch to an affordable insulin since pt will not have insurance after this month.  Inpatient Diabetes Program Recommendations Insulin - Basal: D/C Levemir and change to NPH 10 units bid Insulin - Meal Coverage: Increase Novolog to 4 units tidwc   Note: To meet with Sister-in-law on 9/14 at 9:30 am. Continue to monitor blood sugars.  Thank you. Lorenda Peck, RD, LDN, CDE Inpatient Diabetes Coordinator [phone]

## 2015-02-14 NOTE — Progress Notes (Signed)
PROGRESS NOTE  KAREEN WILLOW AJH:[id number] DOB: 22-May-1961 DOA: 02/11/2015 PCP: No primary care provider on file.   HPI: 54 yo F admitted on 9/10 with DKA. She is s/p 2/5 weeks complicated stay at a hospital in [facility] where she had DKA, rhabdomyolysis induced renal failure requiring temporary HD.  Interim history Patient was initially admitted to step down, placed on insulin drip. Once her anion gap closed and she was transitioned to Levemir as well as sliding scale insulin. Unfortunately she had a couple hypoglycemic episodes, unfortunately she does not have hypoglycemia awareness, her Levemir dose was readjusted with subsequent improvement in her CBGs. After discussing with the patient, she has a hard time affording the Levemir, I've also discussed with the diabetes educator and will adjust her insulin to NPH twice daily.  Subjective / 24 H Interval events - no complaints this morning, eating breakfast - No further hypoglycemia overnight  Assessment/Plan: Principal Problem:   DKA, type 1 Active Problems:   DKA (diabetic ketoacidoses)   AKI (acute kidney injury)   Alcohol abuse, in remission   Anemia   HTN (hypertension)   Hyperkalemia, transcellular shifts   Hyponatremia   Pressure ulcer   IDDM - appears to be quite brittle and without hypoglycemia awarnes - continue to monitor - Hemoglobin A1c pending - Given financial constraints and high cost of Levemir, will change her insulin to NPH 7 units twice daily. Continue to monitor CBGs, continue sliding scale insulin, if she no longer has hypoglycemia may be able to be discharged in 1 day  DKA - resolved, gap closed - transferred to floor on 9/12  Acute kidney injury - Patient without reported renal problems prior to being hospitalized in [place], she has rhabdomyolysis induced renal failure requiring hemodialysis, she seems to be recovering, she is making good urine - renal function stable, improving - It is too early  to tell what her baseline creatinine will be and if she will have chronic kidney disease following the initial insult, however the trend in the last couple of days is very reassuring that she may go back to normal creatinine  HTN - resumed home medications, her blood pressure currently is acceptable, she may need further adjustment of her medications either prior to discharge or as an outpatient. For now continue current regimen.  Anemia - Likely to related to acute recent illness - Her hemoglobin is stable  Pseudohyponatremia - resolved  Alcohol abuse, in remission - Patient has not had anything to drink in the last 3 weeks and she is not triggering CIWA,   Probable bipolar disorder - discussed with her sister outside her room on admission, patient apparently has a history of bipolar disorder however never formally diagnosed since she always refused evaluation. While hospitalized in [facility] she was started on Zoloft, Seroquel and Depakote with significant improvement in her mental stability.  - continue this regimen   Diet: Diet Carb Modified Fluid consistency:: Thin; Room service appropriate?: Yes Fluids: NS DVT Prophylaxis: heparin  Code Status: Full Code Family Communication: d/w sister in law  Disposition Plan: home 1-2 days once CBGs controlled  Consultants:  None   Procedures:  None    Antibiotics  Anti-infectives    None       Studies  No results found.  Objective  Filed Vitals:   02/13/15 2124 02/13/15 2219 02/14/15 0535 02/14/15 0800  BP: 153/55 155/64 150/79   Pulse: 65 66 68 62  Temp: 98 F (36.7 C) 98.4 F (  36.9 C) 98.1 F (36.7 C)   TempSrc: Oral Oral Oral   Resp: 18 18 18    Height:      Weight:      SpO2: 95% 99% 100%     Intake/Output Summary (Last 24 hours) at 02/14/15 1055 Last data filed at 02/14/15 1610  Gross per 24 hour  Intake    720 ml  Output      0 ml  Net    720 ml   Filed Weights   02/11/15 1800  Weight: 65.1 kg (143  lb 8.3 oz)   Exam:  GENERAL: NAD, appears pale  HEENT: head NCAT, no scleral icterus.   LUNGS: Clear to auscultation. No wheezing or crackles  HEART: Regular rate and rhythm without murmur. 2+ pulses, no JVD, no peripheral edema  ABDOMEN: Soft, nontender, and nondistended.   EXTREMITIES: Without any cyanosis, clubbing, rash, lesions or edema.  NEUROLOGIC: Alert and oriented x3, non focal   Data Reviewed: Basic Metabolic Panel:  Recent Labs Lab 02/12/15 0158  02/12/15 1017 02/12/15 1452 02/12/15 1815 02/12/15 2200 02/14/15 0537  NA 141  < > 140 139 139 143 142  K 3.8  < > 3.3* 4.9 4.2 3.9 3.8  CL 109  < > 109 110 109 113* 108  CO2 20*  < > 23 21* 23 23 25   GLUCOSE 99  < > 86 286* 200* 74 228*  BUN 29*  < > 22* 20 22* 21* 15  CREATININE 2.07*  < > 1.82* 1.83* 1.86* 1.76* 1.39*  CALCIUM 8.7*  < > 8.6* 8.3* 8.4* 8.5* 8.4*  MG 1.6*  --   --   --   --   --   --   PHOS 2.5  --   --   --   --   --   --   < > = values in this interval not displayed. Liver Function Tests:  Recent Labs Lab 02/11/15 1401 02/12/15 0158  AST 18 13*  ALT 17 13*  ALKPHOS 122 89  BILITOT 1.7* 0.6  PROT 7.7 6.4*  ALBUMIN 3.6 2.9*   CBC:  Recent Labs Lab 02/11/15 1401 02/12/15 0158 02/14/15 0537  WBC 8.6 9.3 6.7  NEUTROABS 6.3  --   --   HGB 9.6* 7.9* 8.9*  HCT 30.9* 23.7* 27.2*  MCV 97.8 94.0 93.2  PLT 359 309 328   Cardiac Enzymes:  Recent Labs Lab 02/11/15 1810  CKTOTAL 54  TROPONINI 0.05*   CBG:  Recent Labs Lab 02/13/15 0952 02/13/15 1313 02/13/15 1623 02/13/15 2224 02/14/15 0730  GLUCAP 136* 163* 215* 74 229*    Recent Results (from the past 240 hour(s))  MRSA PCR Screening     Status: None   Collection Time: 02/11/15  6:00 PM  Result Value Ref Range Status   MRSA by PCR NEGATIVE NEGATIVE Final    Comment:        The GeneXpert MRSA Assay (FDA approved for NASAL specimens only), is one component of a comprehensive MRSA colonization surveillance  program. It is not intended to diagnose MRSA infection nor to guide or monitor treatment for MRSA infections.    Scheduled Meds: . amLODipine  5 mg Oral Daily  . aspirin EC  81 mg Oral Daily  . carvedilol  6.25 mg Oral BID WC  . divalproex  250 mg Oral BID  . folic acid  1 mg Oral Daily  . heparin  5,000 Units Subcutaneous 3 times per day  .  insulin aspart  0-5 Units Subcutaneous QHS  . insulin aspart  0-9 Units Subcutaneous TID WC  . insulin aspart  2 Units Subcutaneous TID WC  . insulin NPH Human  7 Units Subcutaneous BID AC & HS  . irbesartan  75 mg Oral Daily  . LORazepam  0-4 mg Oral Q12H  . multivitamin with minerals  1 tablet Oral Daily  . QUEtiapine  50 mg Oral QHS  . sertraline  50 mg Oral Daily  . thiamine  100 mg Oral Daily   Or  . thiamine  100 mg Intravenous Daily   Continuous Infusions: . sodium chloride 75 mL/hr at 02/13/15 0700   Pamella Pert, MD [facility] Pager [phone]. If 7 PM - 7 AM, please contact night-coverage at [email or web address], password [facility] 02/14/2015, 10:55 AM  LOS: 3 days

## 2015-02-14 NOTE — Evaluation (Signed)
Physical Therapy One Time Evaluation Patient Details Name: Kimberly Pena MRN: [id number] DOB: 1960/11/10 Today's Date: 02/14/2015   History of Present Illness  54 yo female admitted on 9/10 with DKA. Recent hospitalization in [facility] where she had DKA, rhabdomyolysis induced renal failure requiring temporary HD.  Clinical Impression  Patient evaluated by Physical Therapy with no further acute PT needs identified. All education has been completed and the patient has no further questions. Pt modified independent with mobility however presents with L foot drop.  Pt reports foot drop started after recent hospital admission due to DKA however pt reports she was found down on floor for extended period of time (has dressing to L hip/thigh area and states L shoulder effected as well however denies any fractures). Pt has no active dorsiflexion as well as numbness in L ankle/foot.  Pt educated to f/u with MD if foot drop persists (explained AFO is an option to assist with gait/mobility) as well as educated to check feet daily due to numbness reported.  See below for any follow-up Physical Therapy or equipment needs. PT is signing off. Thank you for this referral.     Follow Up Recommendations No PT follow up (recommended f/u for L foot drop if persists)    Equipment Recommendations  None recommended by PT    Recommendations for Other Services       Precautions / Restrictions Precautions Precautions: Fall Precaution Comments: L foot drop      Mobility  Bed Mobility Overal bed mobility: Modified Independent                Transfers Overall transfer level: Modified independent                  Ambulation/Gait Ambulation/Gait assistance: Supervision;Modified independent (Device/Increase time) Ambulation Distance (Feet): 250 Feet Assistive device: None Gait Pattern/deviations: Step-through pattern;Decreased dorsiflexion - left     General Gait Details: increased L hip and knee  flexion compensation for L foot drop  Stairs            Wheelchair Mobility    Modified Rankin (Stroke Patients Only)       Balance Overall balance assessment: No apparent balance deficits (not formally assessed) (up in room prior to arrival bathing at sink)                                           Pertinent Vitals/Pain Pain Assessment: 0-10 Pain Score: 5  Pain Location: reports pain in bil feet R >L Pain Descriptors / Indicators: Stabbing Pain Intervention(s): Limited activity within patient's tolerance;Monitored during session;Repositioned    Home Living Family/patient expects to be discharged to:: Private residence Living Arrangements: Other relatives;Other (Comment) (plans to stay with brother) Available Help at Discharge: Family Type of Home: House         Home Equipment: None      Prior Function Level of Independence: Independent               Hand Dominance        Extremity/Trunk Assessment               Lower Extremity Assessment: LLE deficits/detail   LLE Deficits / Details: no contraction felt at tibialis anterior, no active dorsiflexion, also reports numbness in R foot (ankle and below)     Communication   Communication: No difficulties  Cognition Arousal/Alertness: Awake/alert  Behavior During Therapy: WFL for tasks assessed/performed Overall Cognitive Status: Within Functional Limits for tasks assessed                      General Comments      Exercises        Assessment/Plan    PT Assessment Patent does not need any further PT services  PT Diagnosis Abnormality of gait   PT Problem List    PT Treatment Interventions     PT Goals (Current goals can be found in the Care Plan section) Acute Rehab PT Goals PT Goal Formulation: All assessment and education complete, DC therapy    Frequency     Barriers to discharge        Co-evaluation               End of Session Equipment  Utilized During Treatment: Gait belt Activity Tolerance: Patient tolerated treatment well Patient left: in chair;with call bell/phone within reach           Time: 6045-4098 PT Time Calculation (min) (ACUTE ONLY): 15 min   Charges:   PT Evaluation $Initial PT Evaluation Tier I: 1 Procedure     PT G Codes:        Vickee Mormino,KATHrine E 02/14/2015, 1:47 PM Zenovia Jarred, PT, DPT 02/14/2015 Pager: [phone]

## 2015-02-15 DIAGNOSIS — E876 Hypokalemia: Secondary | ICD-10-CM | POA: Diagnosis present

## 2015-02-15 DIAGNOSIS — E871 Hypo-osmolality and hyponatremia: Secondary | ICD-10-CM | POA: Insufficient documentation

## 2015-02-15 DIAGNOSIS — E0842 Diabetes mellitus due to underlying condition with diabetic polyneuropathy: Secondary | ICD-10-CM

## 2015-02-15 DIAGNOSIS — E1010 Type 1 diabetes mellitus with ketoacidosis without coma: Principal | ICD-10-CM

## 2015-02-15 DIAGNOSIS — E1140 Type 2 diabetes mellitus with diabetic neuropathy, unspecified: Secondary | ICD-10-CM | POA: Diagnosis present

## 2015-02-15 DIAGNOSIS — D638 Anemia in other chronic diseases classified elsewhere: Secondary | ICD-10-CM

## 2015-02-15 DIAGNOSIS — N179 Acute kidney failure, unspecified: Secondary | ICD-10-CM

## 2015-02-15 DIAGNOSIS — F1010 Alcohol abuse, uncomplicated: Secondary | ICD-10-CM

## 2015-02-15 LAB — CBC
HEMATOCRIT: 27 % — AB (ref 36.0–46.0)
HEMOGLOBIN: 8.9 g/dL — AB (ref 12.0–15.0)
MCH: 30.7 pg (ref 26.0–34.0)
MCHC: 33 g/dL (ref 30.0–36.0)
MCV: 93.1 fL (ref 78.0–100.0)
Platelets: 319 10*3/uL (ref 150–400)
RBC: 2.9 MIL/uL — AB (ref 3.87–5.11)
RDW: 13.7 % (ref 11.5–15.5)
WBC: 7.2 10*3/uL (ref 4.0–10.5)

## 2015-02-15 LAB — BASIC METABOLIC PANEL
Anion gap: 9 (ref 5–15)
BUN: 14 mg/dL (ref 6–20)
CHLORIDE: 106 mmol/L (ref 101–111)
CO2: 27 mmol/L (ref 22–32)
CREATININE: 1.46 mg/dL — AB (ref 0.44–1.00)
Calcium: 8.6 mg/dL — ABNORMAL LOW (ref 8.9–10.3)
GFR calc Af Amer: 47 mL/min — ABNORMAL LOW (ref >60)
GFR calc non Af Amer: 40 mL/min — ABNORMAL LOW (ref >60)
Glucose, Bld: 135 mg/dL — ABNORMAL HIGH (ref 65–99)
POTASSIUM: 3.4 mmol/L — AB (ref 3.5–5.1)
Sodium: 142 mmol/L (ref 135–145)

## 2015-02-15 LAB — GLUCOSE, CAPILLARY
GLUCOSE-CAPILLARY: 77 mg/dL (ref 65–99)
Glucose-Capillary: 251 mg/dL — ABNORMAL HIGH (ref 65–99)
Glucose-Capillary: 308 mg/dL — ABNORMAL HIGH (ref 65–99)
Glucose-Capillary: 128 mg/dL — ABNORMAL HIGH (ref 65–99)
Glucose-Capillary: 50 mg/dL — ABNORMAL LOW (ref 65–99)

## 2015-02-15 LAB — HEMOGLOBIN A1C
Hgb A1c MFr Bld: 7.8 % — ABNORMAL HIGH (ref 4.8–5.6)
MEAN PLASMA GLUCOSE: 177 mg/dL

## 2015-02-15 MED ORDER — POTASSIUM CHLORIDE CRYS ER 20 MEQ PO TBCR
40.0000 meq | EXTENDED_RELEASE_TABLET | Freq: Once | ORAL | Status: AC
  Administered 2015-02-15: 40 meq via ORAL
  Filled 2015-02-15: qty 2

## 2015-02-15 MED ORDER — GABAPENTIN 100 MG PO CAPS
100.0000 mg | ORAL_CAPSULE | Freq: Three times a day (TID) | ORAL | Status: DC
  Administered 2015-02-15 – 2015-02-16 (×3): 100 mg via ORAL
  Filled 2015-02-15 (×4): qty 1

## 2015-02-15 MED ORDER — HYDROCODONE-ACETAMINOPHEN 5-325 MG PO TABS
1.0000 | ORAL_TABLET | ORAL | Status: DC | PRN
  Administered 2015-02-15: 1 via ORAL
  Filled 2015-02-15: qty 1

## 2015-02-15 NOTE — Care Management Note (Signed)
Case Management Note  Patient Details  Name: Kimberly Pena MRN: [id number] Date of Birth: 1961-02-22  Subjective/Objective:[facility] has maed patient pcp appt @ [facility].Patient informed to use [facility] pharmacy for scripts.Patient to go to Dept Social Services for disability f/u.  Patient voiced understanding.                    Action/Plan:d/c plan home.   Expected Discharge Date:   (unknown)               Expected Discharge Plan:  Home/Self Care  In-House Referral:     Discharge planning Services  CM Consult, Indigent Health Clinic  Post Acute Care Choice:    Choice offered to:     DME Arranged:    DME Agency:     HH Arranged:    HH Agency:     Status of Service:  In process, will continue to follow  Medicare Important Message Given:    Date Medicare IM Given:    Medicare IM give by:    Date Additional Medicare IM Given:    Additional Medicare Important Message give by:     If discussed at Long Length of Stay Meetings, dates discussed:    Additional Comments:  Lanier Clam, RN 02/15/2015, 3:09 PM

## 2015-02-15 NOTE — Progress Notes (Signed)
Placed Allevyn foam dressings per order to Lt shoulder and Lt hip.skin intact but discolored.

## 2015-02-15 NOTE — Care Management Note (Signed)
Case Management Note  Patient Details  Name: Kimberly Pena MRN: [id number] Date of Birth: 10/11/1960  Subjective/Objective:Spoke to patient about d/c plans.  No pcp. Provided w/pcp listing,chose [facility], [facility] services will asst w/pcp appt. Also informed [facility] of patient's request for asst w/disability-TCC will let me know if they can asst.                    Action/Plan:d/c plan home.   Expected Discharge Date:   (unknown)               Expected Discharge Plan:  Home/Self Care  In-House Referral:     Discharge planning Services  CM Consult  Post Acute Care Choice:    Choice offered to:     DME Arranged:    DME Agency:     HH Arranged:    HH Agency:     Status of Service:  In process, will continue to follow  Medicare Important Message Given:    Date Medicare IM Given:    Medicare IM give by:    Date Additional Medicare IM Given:    Additional Medicare Important Message give by:     If discussed at Long Length of Stay Meetings, dates discussed:    Additional Comments:  Lanier Clam, RN 02/15/2015, 12:41 PM

## 2015-02-15 NOTE — Progress Notes (Signed)
Patient ID: Kimberly Pena, female   DOB: 22-Aug-1960, 54 y.o.   MRN: [id number]  [facility] PROGRESS NOTE  KATALEENA HOLSAPPLE WUJ:[id number] DOB: 1960-11-24 DOA: 02/11/2015 PCP: No primary care provider on file.   Brief narrative:    54 yo F admitted on 9/10 with DKA. She is s/p 2/5 weeks complicated stay at a hospital in [facility] where she had DKA, rhabdomyolysis induced renal failure requiring temporary HD.  Assessment/Plan:    Principal Problem:   DKA, type 1 - resolved with AG closed - off IVF, advanced diet, if tolerating well with no N/V, possible d/c home in AM  Active Problems:   AKI (acute kidney injury), secondary to recent rhabdo with progression to ATN - has required HD temporary  - Cr is trending down overall but is up in the past 24 hours - pt says she has not been eating much in the past 24 hours  - will hold off on IVF and repeat BMP in AM    Alcohol abuse, in remission - no signs of withdrawal     Anemia of chronic disease, DM - no signs of active bleeding    Hypokalemia - with low Mg level on admission but this was not repeated, will check Mg level again  - supplement and repeat BMP in AM    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  Family Communication:  plan of care discussed with the patient Disposition Plan: Home in AM if Cr trending down and no hypoglycemic events   IV access:  Peripheral IV  Procedures and diagnostic studies:    Dg Chest 2 View 02/11/2015  No active cardiopulmonary disease.     Medical Consultants:  Diabetic educator   Other Consultants:   PT  IAnti-Infectives:   None   Debbora Presto, MD  [facility] Pager [phone]  If 7PM-7AM, please contact night-coverage [email or web address] Password [email or web address] 02/15/2015, 1:29 PM   LOS: 4 days   HPI/Subjective: No events overnight.   Objective: Filed Vitals:   02/14/15 1722 02/14/15 2057 02/15/15 0507 02/15/15 0904  BP: 138/69 144/65 147/61 134/50  Pulse: 60 64 55   Temp:  98.9 F (37.2 C) 98.2 F (36.8 C)   TempSrc:  Oral Oral   Resp:  18 16   Height:      Weight:      SpO2:  98% 97%     Intake/Output Summary (Last 24 hours) at 02/15/15 1329 Last data filed at 02/15/15 0700  Gross per 24 hour  Intake   3720 ml  Output    400 ml  Net   3320 ml    Exam:   General:  Pt is alert, follows commands appropriately, not in acute distress  Cardiovascular: Regular rate and rhythm, S1/S2, no murmurs, no rubs, no gallops  Respiratory: Clear to auscultation bilaterally, no wheezing, no crackles, no rhonchi  Abdomen: Soft, non tender, non distended, bowel sounds present, no guarding  Data Reviewed: Basic Metabolic Panel:  Recent Labs Lab 02/12/15 0158  02/12/15 1452 02/12/15 1815 02/12/15 2200 02/14/15 0537 02/15/15 0433  NA 141  < >  139 139 143 142 142  K 3.8  < > 4.9 4.2 3.9 3.8 3.4*  CL 109  < > 110 109 113* 108 106  CO2 20*  < > 21* 23 23 25 27   GLUCOSE 99  < > 286* 200* 74 228* 135*  BUN 29*  < > 20 22* 21* 15 14  CREATININE 2.07*  < > 1.83* 1.86* 1.76* 1.39* 1.46*  CALCIUM 8.7*  < > 8.3* 8.4* 8.5* 8.4* 8.6*  MG 1.6*  --   --   --   --   --   --   PHOS 2.5  --   --   --   --   --   --   < > = values in this interval not displayed. Liver Function Tests:  Recent Labs Lab 02/11/15 1401 02/12/15 0158  AST 18 13*  ALT 17 13*  ALKPHOS 122 89  BILITOT 1.7* 0.6  PROT 7.7 6.4*  ALBUMIN 3.6 2.9*   No results for input(s): LIPASE, AMYLASE in the last 168 hours. No results for input(s): AMMONIA in the last 168 hours. CBC:  Recent Labs Lab 02/11/15 1401  02/12/15 0158 02/14/15 0537 02/15/15 0433  WBC 8.6 9.3 6.7 7.2  NEUTROABS 6.3  --   --   --   HGB 9.6* 7.9* 8.9* 8.9*  HCT 30.9* 23.7* 27.2* 27.0*  MCV 97.8 94.0 93.2 93.1  PLT 359 309 328 319   Cardiac Enzymes:  Recent Labs Lab 02/11/15 1810  CKTOTAL 54  TROPONINI 0.05*   BNP: Invalid input(s): POCBNP CBG:  Recent Labs Lab 02/14/15 1151 02/14/15 1630 02/14/15 2054 02/15/15 0759 02/15/15 1126  GLUCAP 163* 117* 155* 251* 308*    Recent Results (from the past 240 hour(s))  MRSA PCR Screening     Status: None   Collection Time: 02/11/15  6:00 PM  Result Value Ref Range Status   MRSA by PCR NEGATIVE NEGATIVE Final    Comment:        The GeneXpert MRSA Assay (FDA approved for NASAL specimens only), is one component of a comprehensive MRSA colonization surveillance program. It is not intended to diagnose MRSA infection nor to guide or monitor treatment for MRSA infections.      Scheduled Meds: . amLODipine  5 mg Oral Daily  . aspirin EC  81 mg Oral Daily  . carvedilol  6.25 mg Oral BID WC  . divalproex  250 mg Oral BID  . folic acid  1 mg Oral Daily  . heparin  5,000 Units Subcutaneous 3 times per day  . insulin aspart  0-5 Units Subcutaneous QHS  . insulin aspart  0-9 Units Subcutaneous TID WC  . insulin aspart  2 Units Subcutaneous TID WC  . insulin NPH Human  7 Units Subcutaneous BID AC & HS  . LORazepam  0-4 mg Oral Q12H  . multivitamin with minerals  1 tablet Oral Daily  . QUEtiapine  50 mg Oral QHS  . sertraline  50 mg Oral Daily  . thiamine  100 mg Oral Daily   Continuous Infusions: . sodium chloride 75 mL/hr at 02/13/15 0700

## 2015-02-15 NOTE — Consult Note (Signed)
WOC wound consult note Reason for Consult:Evaluate pressure injuries  Wound type: resolving pressure injuries on the left shoulder (5cm x 7cm with no depth) and left hip (18cm x 9cm with no depth). Areas have recently reepithelialized and are dry, albeit fragile. Pressure Ulcer POA: Yes Measurement:See above Wound bed:See above Drainage (amount, consistency, odor) None Periwound:Intact, dry Dressing procedure/placement/frequency:I have initiated a nursing POC for placement of soft silicone foam dressings to the affected areas with twice weekly changes. WOC nursing team will not follow, but will remain available to this patient, the nursing and medical teams.  Please re-consult if needed. Thanks, Ladona Mow, MSN, RN, GNP, [place], CWON-AP [phone])

## 2015-02-15 NOTE — Care Management (Signed)
[facility]:  Received call from Lanier Clam, RN CM that patient needing hospital follow-up appointment. Patient does not have a PCP.  Hospital follow-up appointment obtained on 02/21/15 at 1500 at [facility]. Appointment added to AVS. Patient able to use [facility] for prescriptions if needed. Lanier Clam, RN CM updated.

## 2015-02-16 DIAGNOSIS — E1110 Type 2 diabetes mellitus with ketoacidosis without coma: Secondary | ICD-10-CM | POA: Insufficient documentation

## 2015-02-16 DIAGNOSIS — E1310 Other specified diabetes mellitus with ketoacidosis without coma: Secondary | ICD-10-CM

## 2015-02-16 DIAGNOSIS — E0841 Diabetes mellitus due to underlying condition with diabetic mononeuropathy: Secondary | ICD-10-CM

## 2015-02-16 LAB — CBC
HEMATOCRIT: 27.6 % — AB (ref 36.0–46.0)
HEMOGLOBIN: 8.9 g/dL — AB (ref 12.0–15.0)
MCH: 30 pg (ref 26.0–34.0)
MCHC: 32.2 g/dL (ref 30.0–36.0)
MCV: 92.9 fL (ref 78.0–100.0)
Platelets: 339 10*3/uL (ref 150–400)
RBC: 2.97 MIL/uL — ABNORMAL LOW (ref 3.87–5.11)
RDW: 14 % (ref 11.5–15.5)
WBC: 7.5 10*3/uL (ref 4.0–10.5)

## 2015-02-16 LAB — BASIC METABOLIC PANEL
ANION GAP: 9 (ref 5–15)
BUN: 19 mg/dL (ref 6–20)
CALCIUM: 8.6 mg/dL — AB (ref 8.9–10.3)
CO2: 26 mmol/L (ref 22–32)
Chloride: 103 mmol/L (ref 101–111)
Creatinine, Ser: 1.51 mg/dL — ABNORMAL HIGH (ref 0.44–1.00)
GFR calc Af Amer: 45 mL/min — ABNORMAL LOW (ref >60)
GFR, EST NON AFRICAN AMERICAN: 39 mL/min — AB (ref >60)
GLUCOSE: 387 mg/dL — AB (ref 65–99)
POTASSIUM: 4.6 mmol/L (ref 3.5–5.1)
SODIUM: 138 mmol/L (ref 135–145)

## 2015-02-16 LAB — GLUCOSE, CAPILLARY
GLUCOSE-CAPILLARY: 386 mg/dL — AB (ref 65–99)
GLUCOSE-CAPILLARY: 312 mg/dL — AB (ref 65–99)
GLUCOSE-CAPILLARY: 148 mg/dL — AB (ref 65–99)

## 2015-02-16 LAB — MAGNESIUM: MAGNESIUM: 1.7 mg/dL (ref 1.7–2.4)

## 2015-02-16 MED ORDER — AMLODIPINE BESYLATE 5 MG PO TABS: 5.0000 mg | ORAL_TABLET | Freq: Every day | ORAL | Status: DC

## 2015-02-16 MED ORDER — INSULIN NPH (HUMAN) (ISOPHANE) 100 UNIT/ML ~~LOC~~ SUSP: 7.0000 [IU] | Freq: Two times a day (BID) | SUBCUTANEOUS | Status: DC

## 2015-02-16 MED ORDER — DIVALPROEX SODIUM ER 250 MG PO TB24: 250.0000 mg | ORAL_TABLET | Freq: Two times a day (BID) | ORAL | Status: DC

## 2015-02-16 MED ORDER — HYDROCODONE-ACETAMINOPHEN 5-325 MG PO TABS: 1.0000 | ORAL_TABLET | ORAL | Status: DC | PRN

## 2015-02-16 MED ORDER — INSULIN NPH (HUMAN) (ISOPHANE) 100 UNIT/ML ~~LOC~~ SUSP: 8.0000 [IU] | Freq: Two times a day (BID) | SUBCUTANEOUS | Status: DC

## 2015-02-16 MED ORDER — CARVEDILOL 6.25 MG PO TABS: 6.2500 mg | ORAL_TABLET | Freq: Two times a day (BID) | ORAL | Status: DC

## 2015-02-16 MED ORDER — CARVEDILOL 6.25 MG PO TABS: 6.2500 mg | ORAL_TABLET | Freq: Two times a day (BID) | ORAL | Status: AC

## 2015-02-16 MED ORDER — SERTRALINE HCL 50 MG PO TABS: 50.0000 mg | ORAL_TABLET | Freq: Every day | ORAL | Status: DC

## 2015-02-16 MED ORDER — GABAPENTIN 100 MG PO CAPS: 100.0000 mg | ORAL_CAPSULE | Freq: Three times a day (TID) | ORAL | Status: DC

## 2015-02-16 MED ORDER — QUETIAPINE FUMARATE 50 MG PO TABS: 50.0000 mg | ORAL_TABLET | Freq: Every day | ORAL | Status: DC

## 2015-02-16 MED ORDER — GABAPENTIN 100 MG PO CAPS: 100.0000 mg | ORAL_CAPSULE | Freq: Three times a day (TID) | ORAL | Status: AC

## 2015-02-16 NOTE — Progress Notes (Signed)
Pt awaiting ride @1400 .

## 2015-02-16 NOTE — Care Management Note (Signed)
Case Management Note  Patient Details  Name: Kimberly Pena MRN: [id number] Date of Birth: [birth year]  Subjective/Objective: Md has requested PT to re eval-patient has peripheral neuropathy, & may need 3n1,rw.PT cons-imminent d/c-paged for eval.                   Action/Plan:d/c plan home.   Expected Discharge Date:   (unknown)               Expected Discharge Plan:  Home/Self Care  In-House Referral:     Discharge planning Services  CM Consult, Indigent Health Clinic  Post Acute Care Choice:    Choice offered to:     DME Arranged:    DME Agency:     HH Arranged:    HH Agency:     Status of Service:  In process, will continue to follow  Medicare Important Message Given:    Date Medicare IM Given:    Medicare IM give by:    Date Additional Medicare IM Given:    Additional Medicare Important Message give by:     If discussed at Long Length of Stay Meetings, dates discussed:    Additional Comments:  Lanier Clam, RN 02/16/2015, 10:53 AM

## 2015-02-16 NOTE — Care Management Note (Signed)
Case Management Note  Patient Details  Name: LYASIA NORTH MRN: [id number] Date of Birth: June 30, 1960  Subjective/Objective: Patient re-eval by PT-no f/u needs.Re-iterrated the [facility] pahramcy for meds to be filled, & reminder of pcp appt.                   Action/Plan:d/c home no further d/c needs.   Expected Discharge Date:   (unknown)               Expected Discharge Plan:  Home/Self Care  In-House Referral:     Discharge planning Services  CM Consult, Indigent Health Clinic  Post Acute Care Choice:    Choice offered to:     DME Arranged:    DME Agency:     HH Arranged:    HH Agency:     Status of Service:  Completed, signed off  Medicare Important Message Given:    Date Medicare IM Given:    Medicare IM give by:    Date Additional Medicare IM Given:    Additional Medicare Important Message give by:     If discussed at Long Length of Stay Meetings, dates discussed:    Additional Comments:  Lanier Clam, RN 02/16/2015, 12:36 PM

## 2015-02-16 NOTE — Progress Notes (Signed)
Pt discharge home with follow up with PCP. Instructions on medications given. Pt verbalized understanding of same.

## 2015-02-16 NOTE — Discharge Summary (Signed)
Physician Discharge Summary  Kimberly Pena WJX:[id number] DOB: 10-30-1960 DOA: 02/11/2015  PCP: No primary care provider on file.  Admit date: 02/11/2015 Discharge date: 02/16/2015  Recommendations for Outpatient Follow-up:  1. Pt will need to follow up with PCP in 2-3 weeks post discharge 2. Please obtain BMP to evaluate electrolytes and kidney function 3. Please also check CBC to evaluate Hg and Hct levels 4. 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 She is s/p 2/5 weeks complicated stay at a hospital in [facility] where she had DKA, rhabdomyolysis induced renal failure requiring temporary HD.  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  Family Communication: plan of care discussed with the patient Disposition Plan: Home  IV access:  Peripheral IV  Procedures and diagnostic studies:   Dg Chest 2 View 02/11/2015 No active cardiopulmonary disease.   Medical Consultants:  Diabetic educator   Other Consultants:  PT  IAnti-Infectives:   None      Discharge Exam: Filed Vitals:   02/16/15 0607  BP: 143/58  Pulse: 57  Temp: 98.5 F (36.9 C)  Resp: 18   Filed Vitals:   02/15/15 0904 02/15/15 1408 02/15/15 2131 02/16/15 0607  BP: 134/50 125/52 135/62 143/58  Pulse:  64 59 57  Temp:  98.2 F (36.8 C) 98 F (36.7 C) 98.5 F (36.9 C)  TempSrc:  Oral Oral Oral  Resp:   18 18  Height:      Weight:      SpO2:  99% 100% 98%    General: Pt is alert, follows commands appropriately, not in acute distress Cardiovascular: Regular rate and rhythm, S1/S2 +, no murmurs, no rubs, no gallops Respiratory: Clear to auscultation bilaterally, no wheezing, no crackles, no rhonchi Abdominal: Soft, non  tender, non distended, bowel sounds +, no guarding  Discharge Instructions  Discharge Instructions    Diet - low sodium heart healthy    Complete by:  As directed      Increase activity slowly    Complete by:  As directed             Medication List    STOP taking these medications        HUMALOG KWIKPEN 100 UNIT/ML KiwkPen  Generic drug:  insulin lispro     insulin detemir 100 UNIT/ML injection  Commonly known as:  LEVEMIR     valsartan 160 MG tablet  Commonly known as:  DIOVAN      TAKE these medications         amLODipine 5 MG tablet  Commonly known as:  NORVASC  Take 1 tablet (5 mg total) by mouth daily.     aspirin EC 81 MG tablet  Take 81 mg by mouth daily.     carvedilol 6.25 MG tablet  Commonly known as:  COREG  Take 1 tablet (6.25 mg total) by mouth 2 (two) times daily with a meal.     divalproex 250 MG 24 hr tablet  Commonly known as:  DEPAKOTE ER  Take 1 tablet (250 mg total) by mouth 2 (two) times daily.     gabapentin 100 MG capsule  Commonly known as:  NEURONTIN  Take 1 capsule (100 mg total) by mouth 3 (three) times daily.     HYDROcodone-acetaminophen 5-325 MG per tablet  Commonly known as:  NORCO/VICODIN  Take 1-2 tablets by mouth every 4 (four) hours as needed for moderate pain.     insulin NPH Human 100 UNIT/ML injection  Commonly known as:  HUMULIN N,NOVOLIN N  Inject 0.08 mLs (8 Units total) into the skin 2 (two) times daily at 8 am and 10 pm.     QUEtiapine 50 MG tablet  Commonly known as:  SEROQUEL  Take 1 tablet (50 mg total) by mouth at bedtime.     sertraline 50 MG tablet  Commonly known as:  ZOLOFT  Take 1 tablet (50 mg total) by mouth daily.            Follow-up Information    Schedule an appointment as soon as possible for a visit with Carlus Pavlov, MD.   Specialty:  Internal Medicine   Contact information:   [address] [place] [address] [phone]       Follow up with [facility] On 02/21/2015.   Why:  Hospital follow-up appointment on 02/21/15 at 3:00 pm. Able to use [facility] ([address] [facility]) pharmacy for medications.   Contact information:   [address] [place] [address] [phone]      Call Debbora Presto, MD.   Specialty:  Internal Medicine   Why:  As needed call my cell phone [phone]   Contact information:   [address] [place] [address] [phone]        The results  of significant diagnostics from this hospitalization (including imaging, microbiology, ancillary and laboratory) are listed below for reference.     Microbiology: Recent Results (from the past 240 hour(s))  MRSA PCR Screening     Status: None   Collection Time: 02/11/15  6:00 PM  Result Value Ref Range Status   MRSA by PCR NEGATIVE NEGATIVE Final    Comment:  The GeneXpert MRSA Assay (FDA approved for NASAL specimens only), is one component of a comprehensive MRSA colonization surveillance program. It is not intended to diagnose MRSA infection nor to guide or monitor treatment for MRSA infections.      Labs: Basic Metabolic Panel:  Recent Labs Lab 02/12/15 0158  02/12/15 1815 02/12/15 2200 02/14/15 0537 02/15/15 0433 02/16/15 0440  NA 141  < > 139 143 142 142 138  K 3.8  < > 4.2 3.9 3.8 3.4* 4.6  CL 109  < > 109 113* 108 106 103  CO2 20*  < > GLUCOSE 99  < > 200* 74 228* 135* 387*  BUN 29*  < > 22* 21* CREATININE 2.07*  < > 1.86* 1.76* 1.39* 1.46* 1.51*  CALCIUM 8.7*  < > 8.4* 8.5* 8.4* 8.6* 8.6*  MG 1.6*  --   --   --   --   --  1.7  PHOS 2.5  --   --   --   --   --   --   < > = values in this interval not displayed. Liver Function Tests:  Recent Labs Lab 02/11/15 1401 02/12/15 0158  AST 18 13*  ALT 17 13*  ALKPHOS 122 89  BILITOT 1.7* 0.6  PROT 7.7 6.4*  ALBUMIN 3.6 2.9*   No results for input(s): LIPASE, AMYLASE in the last 168 hours. No results for input(s): AMMONIA in the last 168 hours. CBC:  Recent Labs Lab 02/11/15 1401 02/12/15 0158 02/14/15 0537 02/15/15 0433 02/16/15 0440  WBC 8.6 9.3 6.7 7.2 7.5  NEUTROABS 6.3  --   --   --   --   HGB 9.6* 7.9* 8.9* 8.9* 8.9*  HCT 30.9* 23.7* 27.2* 27.0* 27.6*  MCV 97.8 94.0 93.2 93.1 92.9  PLT 359 309 328 319 339   Cardiac Enzymes:  Recent Labs Lab 02/11/15 1810  CKTOTAL 54  TROPONINI 0.05*   BNP: BNP (last 3 results) No results for input(s): BNP in the  last 8760 hours.  ProBNP (last 3 results) No results for input(s): PROBNP in the last 8760 hours.  CBG:  Recent Labs Lab 02/15/15 1703 02/15/15 2115 02/15/15 2203 02/16/15 0738 02/16/15 0956  GLUCAP 128* 50* 77 386* 312*     SIGNED: Time coordinating discharge: 30 minutes  MAGICK-Alexis Mizuno, MD  [facility] 02/16/2015, 10:47 AM Pager [phone]  If 7PM-7AM, please contact night-coverage [email or web address] Password [email or web address]

## 2015-02-16 NOTE — Progress Notes (Signed)
PT Cancellation Note / Screen  Patient Details Name: Kimberly Pena MRN: [id number] DOB: 1961/01/28   Cancelled Treatment:      Please see previous evaluation note.  Pt not requiring UE support for mobility and up in room without supervision prior to arrival eval date 02/14/15 therefore no equipment was recommended.  Pt was encouraged at that time to f/u for AFO if foot drop persists.   Kerem Gilmer,KATHrine E 02/16/2015, 11:53 AM Zenovia Jarred, PT, DPT 02/16/2015 Pager: [phone]

## 2015-02-16 NOTE — Discharge Instructions (Signed)
Diabetes and Exercise Exercising regularly is important. It is not just about losing weight. It has many health benefits, such as:  Improving your overall fitness, flexibility, and endurance.  Increasing your bone density.  Helping with weight control.  Decreasing your body fat.  Increasing your muscle strength.  Reducing stress and tension.  Improving your overall health. People with diabetes who exercise gain additional benefits because exercise:  Reduces appetite.  Improves the body's use of blood sugar (glucose).  Helps lower or control blood glucose.  Decreases blood pressure.  Helps control blood lipids (such as cholesterol and triglycerides).  Improves the body's use of the hormone insulin by:  Increasing the body's insulin sensitivity.  Reducing the body's insulin needs.  Decreases the risk for heart disease because exercising:  Lowers cholesterol and triglycerides levels.  Increases the levels of good cholesterol (such as high-density lipoproteins [HDL]) in the body.  Lowers blood glucose levels. YOUR ACTIVITY PLAN  Choose an activity that you enjoy and set realistic goals. Your health care provider or diabetes educator can help you make an activity plan that works for you. Exercise regularly as directed by your health care provider. This includes:  Performing resistance training twice a week such as push-ups, sit-ups, lifting weights, or using resistance bands.  Performing 150 minutes of cardio exercises each week such as walking, running, or playing sports.  Staying active and spending no more than 90 minutes at one time being inactive. Even short bursts of exercise are good for you. Three 10-minute sessions spread throughout the day are just as beneficial as a single 30-minute session. Some exercise ideas include:  Taking the dog for a walk.  Taking the stairs instead of the elevator.  Dancing to your favorite song.  Doing an exercise  video.  Doing your favorite exercise with a friend. RECOMMENDATIONS FOR EXERCISING WITH TYPE 1 OR TYPE 2 DIABETES   Check your blood glucose before exercising. If blood glucose levels are greater than 240 mg/dL, check for urine ketones. Do not exercise if ketones are present.  Avoid injecting insulin into areas of the body that are going to be exercised. For example, avoid injecting insulin into:  The arms when playing tennis.  The legs when jogging.  Keep a record of:  Food intake before and after you exercise.  Expected peak times of insulin action.  Blood glucose levels before and after you exercise.  The type and amount of exercise you have done.  Review your records with your health care provider. Your health care provider will help you to develop guidelines for adjusting food intake and insulin amounts before and after exercising.  If you take insulin or oral hypoglycemic agents, watch for signs and symptoms of hypoglycemia. They include:  Dizziness.  Shaking.  Sweating.  Chills.  Confusion.  Drink plenty of water while you exercise to prevent dehydration or heat stroke. Body water is lost during exercise and must be replaced.  Talk to your health care provider before starting an exercise program to make sure it is safe for you. Remember, almost any type of activity is better than none. Document Released: 08/10/2003 Document Revised: 10/04/2013 Document Reviewed: 10/27/2012 [employer] Patient Information 2015 [employer]. This information is not intended to replace advice given to you by your health care provider. Make sure you discuss any questions you have with your health care provider.  

## 2015-02-19 DIAGNOSIS — I219 Acute myocardial infarction, unspecified: Secondary | ICD-10-CM

## 2015-02-19 HISTORY — DX: Acute myocardial infarction, unspecified: I21.9

## 2015-02-21 ENCOUNTER — Emergency Department (HOSPITAL_COMMUNITY)
Admission: EM | Admit: 2015-02-21 | Discharge: 2015-02-21 | Discharge status: Against Medical Advice | Payer: BLUE CROSS/BLUE SHIELD | Attending: Emergency Medicine | Admitting: Emergency Medicine

## 2015-02-21 ENCOUNTER — Emergency Department (HOSPITAL_COMMUNITY)
Admission: EM | Admit: 2015-02-21 | Discharge: 2015-02-21 | Disposition: A | Discharge status: Home / Self Care | Payer: BLUE CROSS/BLUE SHIELD | Source: Home / Self Care | Attending: Emergency Medicine | Admitting: Emergency Medicine

## 2015-02-21 ENCOUNTER — Encounter (HOSPITAL_COMMUNITY): Payer: Self-pay | Admitting: Emergency Medicine

## 2015-02-21 ENCOUNTER — Encounter (HOSPITAL_COMMUNITY): Payer: Self-pay

## 2015-02-21 ENCOUNTER — Ambulatory Visit (INDEPENDENT_AMBULATORY_CARE_PROVIDER_SITE_OTHER): Payer: BLUE CROSS/BLUE SHIELD | Admitting: Family Medicine

## 2015-02-21 ENCOUNTER — Encounter: Payer: Self-pay | Admitting: Family Medicine

## 2015-02-21 DIAGNOSIS — Z7982 Long term (current) use of aspirin: Secondary | ICD-10-CM | POA: Insufficient documentation

## 2015-02-21 DIAGNOSIS — E1165 Type 2 diabetes mellitus with hyperglycemia: Secondary | ICD-10-CM

## 2015-02-21 DIAGNOSIS — E11649 Type 2 diabetes mellitus with hypoglycemia without coma: Secondary | ICD-10-CM | POA: Insufficient documentation

## 2015-02-21 DIAGNOSIS — Z79899 Other long term (current) drug therapy: Secondary | ICD-10-CM | POA: Insufficient documentation

## 2015-02-21 DIAGNOSIS — J449 Chronic obstructive pulmonary disease, unspecified: Secondary | ICD-10-CM | POA: Insufficient documentation

## 2015-02-21 DIAGNOSIS — Z794 Long term (current) use of insulin: Secondary | ICD-10-CM | POA: Insufficient documentation

## 2015-02-21 DIAGNOSIS — Z87891 Personal history of nicotine dependence: Secondary | ICD-10-CM | POA: Insufficient documentation

## 2015-02-21 DIAGNOSIS — Z862 Personal history of diseases of the blood and blood-forming organs and certain disorders involving the immune mechanism: Secondary | ICD-10-CM | POA: Diagnosis not present

## 2015-02-21 DIAGNOSIS — Z792 Long term (current) use of antibiotics: Secondary | ICD-10-CM | POA: Insufficient documentation

## 2015-02-21 DIAGNOSIS — E10649 Type 1 diabetes mellitus with hypoglycemia without coma: Secondary | ICD-10-CM | POA: Insufficient documentation

## 2015-02-21 DIAGNOSIS — Z859 Personal history of malignant neoplasm, unspecified: Secondary | ICD-10-CM | POA: Insufficient documentation

## 2015-02-21 DIAGNOSIS — R739 Hyperglycemia, unspecified: Secondary | ICD-10-CM

## 2015-02-21 DIAGNOSIS — I252 Old myocardial infarction: Secondary | ICD-10-CM | POA: Insufficient documentation

## 2015-02-21 DIAGNOSIS — E162 Hypoglycemia, unspecified: Secondary | ICD-10-CM

## 2015-02-21 LAB — URINALYSIS, ROUTINE W REFLEX MICROSCOPIC
Bilirubin Urine: NEGATIVE
GLUCOSE, UA: NEGATIVE mg/dL
HGB URINE DIPSTICK: NEGATIVE
KETONES UR: NEGATIVE mg/dL
LEUKOCYTES UA: NEGATIVE
Nitrite: NEGATIVE
PH: 6 (ref 5.0–8.0)
PROTEIN: NEGATIVE mg/dL
Specific Gravity, Urine: 1.006 (ref 1.005–1.030)
Urobilinogen, UA: 0.2 mg/dL (ref 0.0–1.0)

## 2015-02-21 LAB — GLUCOSE, CAPILLARY
GLUCOSE-CAPILLARY: 20 mg/dL — AB (ref 65–99)
GLUCOSE-CAPILLARY: 20 mg/dL — AB (ref 65–99)

## 2015-02-21 LAB — RAPID URINE DRUG SCREEN, HOSP PERFORMED
AMPHETAMINES: NOT DETECTED
BARBITURATES: NOT DETECTED
BENZODIAZEPINES: NOT DETECTED
Cocaine: NOT DETECTED
Opiates: NOT DETECTED
Tetrahydrocannabinol: NOT DETECTED

## 2015-02-21 LAB — CBC
HEMATOCRIT: 30.5 % — AB (ref 36.0–46.0)
HEMOGLOBIN: 10 g/dL — AB (ref 12.0–15.0)
MCH: 30.8 pg (ref 26.0–34.0)
MCHC: 32.8 g/dL (ref 30.0–36.0)
MCV: 93.8 fL (ref 78.0–100.0)
Platelets: 361 10*3/uL (ref 150–400)
RBC: 3.25 MIL/uL — AB (ref 3.87–5.11)
RDW: 14.3 % (ref 11.5–15.5)
WBC: 8.4 10*3/uL (ref 4.0–10.5)

## 2015-02-21 LAB — COMPREHENSIVE METABOLIC PANEL
ALK PHOS: 90 U/L (ref 38–126)
ALT: 19 U/L (ref 14–54)
ANION GAP: 12 (ref 5–15)
AST: 29 U/L (ref 15–41)
Albumin: 3.9 g/dL (ref 3.5–5.0)
BILIRUBIN TOTAL: 0.5 mg/dL (ref 0.3–1.2)
BUN: 16 mg/dL (ref 6–20)
CALCIUM: 9.5 mg/dL (ref 8.9–10.3)
CO2: 24 mmol/L (ref 22–32)
CREATININE: 1.25 mg/dL — AB (ref 0.44–1.00)
Chloride: 102 mmol/L (ref 101–111)
GFR calc non Af Amer: 49 mL/min — ABNORMAL LOW (ref >60)
GFR, EST AFRICAN AMERICAN: 56 mL/min — AB (ref >60)
Glucose, Bld: 80 mg/dL (ref 65–99)
Potassium: 3.6 mmol/L (ref 3.5–5.1)
SODIUM: 138 mmol/L (ref 135–145)
TOTAL PROTEIN: 7.6 g/dL (ref 6.5–8.1)

## 2015-02-21 LAB — CBG MONITORING, ED
GLUCOSE-CAPILLARY: 81 mg/dL (ref 65–99)
Glucose-Capillary: 69 mg/dL (ref 65–99)
Glucose-Capillary: 90 mg/dL (ref 65–99)
Glucose-Capillary: 218 mg/dL — ABNORMAL HIGH (ref 65–99)

## 2015-02-21 LAB — I-STAT CG4 LACTIC ACID, ED: Lactic Acid, Venous: 0.75 mmol/L (ref 0.5–2.0)

## 2015-02-21 LAB — ETHANOL

## 2015-02-21 NOTE — Progress Notes (Signed)
Glucose gel was administered by Christophe Louis, RN and Julianne Handler, FNP.  Dentures were removed and given to patients sister-in-law who accompanied the visit.

## 2015-02-21 NOTE — ED Notes (Signed)
Pt presents with hyperglycemia. Took glycemic control medications today (this morning). Has been diabetic for 30+ years. Family refuses to bring her medications until the football game is over. Pt denies any other symptoms. Left AMA earlier today. Pt is A&Ox4. Ambulatory with steady gait.

## 2015-02-21 NOTE — ED Notes (Signed)
YAO at bedside.

## 2015-02-21 NOTE — ED Notes (Signed)
Pt transported by [facility] for hypoglycemia.  Seen at [facility] today for first post discharge visit - pt dc'ed one week ago for DKA/renal problems.  Staff entered room and patient was found to be less responsive - CBG 22 - was given oral glucose with no response.  Pt was given 12.5 dextrose en route and CBG = 134.   Pt becomes agitated after episode of hypoglycemia and is yelling at staff. Pt ate a meal at 2pm today.  Pt has difficulty controlling blood sugar at home.

## 2015-02-21 NOTE — ED Notes (Signed)
Family called asking if it was correct that pt was discharged. Told charge nurse that pt called and said she was ready to be picked up. Pt left AMA.  And charge told family that if pt said she was ready to be picked up then that was correct based on pts decisions.

## 2015-02-21 NOTE — ED Notes (Signed)
Spoke in length with the patient and sister in law in reference to sliding scale. Discussed difference between the types of insulin, importance of checking glucose levels, use of scheduled and sliding scale insulins, and importance of eating meals with use of insulin. Pt verbalized understanding and reports she will log her insulin and glucose levels, agree to follow up with endocrinology and PCP.

## 2015-02-21 NOTE — Progress Notes (Signed)
   Subjective:    Patient ID: Kimberly Pena, female    DOB: May 01, 1961, 54 y.o.   MRN: [id number]  HPI   Ms. Shenica Bliven, a 54 year old patient with a history of type I diabetes presents to establish care and post hospital follow-up accompanied by sister-in law. Unable to communicate with patient. As I walked in room, patient was disoriented, diaphoretic, and non responsive. Checked CBG, which was 20. Called EMS, gave patient glucose gel via sublingual route. Office staff present. Patient's sister-in-law present, was able to provide history.  Past Medical History  Diagnosis Date  . Blood transfusion without reported diagnosis   . Cancer   . COPD (chronic obstructive pulmonary disease)   . Heart attack 02/19/15     Medication List       This list is accurate as of: 02/21/15  4:22 PM.  Always use your most recent med list.               amLODipine 5 MG tablet  Commonly known as:  NORVASC  Take 1 tablet (5 mg total) by mouth daily.     aspirin EC 81 MG tablet  Take 81 mg by mouth daily.     carvedilol 6.25 MG tablet  Commonly known as:  COREG  Take 1 tablet (6.25 mg total) by mouth 2 (two) times daily with a meal.     divalproex 250 MG 24 hr tablet  Commonly known as:  DEPAKOTE ER  Take 1 tablet (250 mg total) by mouth 2 (two) times daily.     gabapentin 100 MG capsule  Commonly known as:  NEURONTIN  Take 1 capsule (100 mg total) by mouth 3 (three) times daily.     HYDROcodone-acetaminophen 5-325 MG per tablet  Commonly known as:  NORCO/VICODIN  Take 1-2 tablets by mouth every 4 (four) hours as needed for moderate pain.     insulin NPH Human 100 UNIT/ML injection  Commonly known as:  HUMULIN N,NOVOLIN N  Inject 0.08 mLs (8 Units total) into the skin 2 (two) times daily at 8 am and 10 pm.     QUEtiapine 50 MG tablet  Commonly known as:  SEROQUEL  Take 1 tablet (50 mg total) by mouth at bedtime.     sertraline 50 MG tablet  Commonly known as:  ZOLOFT  Take 1 tablet  (50 mg total) by mouth daily.       Review of Systems     Objective:   Physical Exam  Constitutional: She has a sickly appearance.  Disoriented, unresponsive, and diaphoretic.   Eyes: Right pupil is not round. Right pupil is reactive. Left pupil is not round. Left pupil is reactive.  Cardiovascular: S1 normal and S2 normal.   Neurological: She is unresponsive.        .BP 162/60 mmHg  Pulse 76  Temp(Src) 98 F (36.7 C) (Oral)  Resp 14  Wt 150 lb (68.04 kg) Assessment & Plan:   1. Hypoglycemia due to type 1 diabetes mellitus  Patient left office for higher level of care accompanied by sister-in-law.  Patient alert and oriented times 3 and CBG 135 prior to transport. Patient transported to emergency room via EMS at 1621.    Massie Maroon, FNP

## 2015-02-21 NOTE — ED Provider Notes (Signed)
CSN: [id number]     Arrival date & time 02/21/15  1628 History   First MD Initiated Contact with Patient 02/21/15 1639     Chief Complaint  Patient presents with  . Hypoglycemia     (Consider location/radiation/quality/duration/timing/severity/associated sxs/prior Treatment) HPI Comments: 54 year old female with a past medical history of type 1 diabetes and COPD presenting via EMS from PCPs office with hypoglycemia. She was establishing care with a PCP today after discharge from the hospital with DKA, and on arrival to their office she was noted to be altered and had a blood sugar of 20. She was given oral glucose and glucose gel along with 12.5 dextrose with CBG increasing to 134. On arrival, CBG 81. Reports difficulty controlling her blood sugar at home. States she is taking the medications as prescribed and keeps a log of her blood sugars, however cannot stay with the blood sugars aren't states "if you want to know the levels, look at the fucking log in my pocketbook". States a caregiver brought her to the PCPs office today, however this caregiver is not present on initial evaluation. On chart review, the caregiver is noted to be her daughter. Patient is very agitated and does not want to answer many questions. Denies chest pain, shortness of breath, abdominal pain, nausea, vomiting, diarrhea, HA, dizziness, lightheadedness, weakness, confusion.  The history is provided by the patient.    Past Medical History  Diagnosis Date  . Blood transfusion without reported diagnosis   . Cancer   . COPD (chronic obstructive pulmonary disease)   . Heart attack 02/19/15  . Diabetes mellitus without complication    History reviewed. No pertinent past surgical history. No family history on file. Social History  Substance Use Topics  . Smoking status: Former Games developer  . Smokeless tobacco: None  . Alcohol Use: No   OB History    No data available     Review of Systems  Gastrointestinal: Negative  for nausea, vomiting and abdominal pain.  Neurological: Negative for dizziness, light-headedness and headaches.  All other systems reviewed and are negative.     Allergies  Review of patient's allergies indicates no known allergies.  Home Medications   Prior to Admission medications   Medication Sig Start Date End Date Taking? Authorizing Kelsie Zaborowski  amLODipine (NORVASC) 5 MG tablet Take 1 tablet (5 mg total) by mouth daily. Patient not taking: Reported on 02/21/2015 02/16/15   Dorothea Ogle, MD  aspirin EC 81 MG tablet Take 81 mg by mouth daily.    Historical Jaiyana Canale, MD  carvedilol (COREG) 6.25 MG tablet Take 1 tablet (6.25 mg total) by mouth 2 (two) times daily with a meal. Patient not taking: Reported on 02/21/2015 02/16/15   Dorothea Ogle, MD  divalproex (DEPAKOTE ER) 250 MG 24 hr tablet Take 1 tablet (250 mg total) by mouth 2 (two) times daily. Patient not taking: Reported on 02/21/2015 02/16/15   Dorothea Ogle, MD  gabapentin (NEURONTIN) 100 MG capsule Take 1 capsule (100 mg total) by mouth 3 (three) times daily. Patient not taking: Reported on 02/21/2015 02/16/15   Dorothea Ogle, MD  HYDROcodone-acetaminophen (NORCO/VICODIN) 5-325 MG per tablet Take 1-2 tablets by mouth every 4 (four) hours as needed for moderate pain. Patient not taking: Reported on 02/21/2015 02/16/15   Dorothea Ogle, MD  insulin NPH Human (HUMULIN N,NOVOLIN N) 100 UNIT/ML injection Inject 0.08 mLs (8 Units total) into the skin 2 (two) times daily at 8 am and 10 pm. Patient  not taking: Reported on 02/21/2015 02/16/15   Dorothea Ogle, MD  QUEtiapine (SEROQUEL) 50 MG tablet Take 1 tablet (50 mg total) by mouth at bedtime. Patient not taking: Reported on 02/21/2015 02/16/15   Dorothea Ogle, MD  sertraline (ZOLOFT) 50 MG tablet Take 1 tablet (50 mg total) by mouth daily. Patient not taking: Reported on 02/21/2015 02/16/15   Dorothea Ogle, MD   BP 178/95 mmHg  Pulse 78  Resp 16  SpO2 98% Physical Exam  Constitutional: She is  oriented to person, place, and time. She appears well-developed and well-nourished. No distress.  HENT:  Head: Normocephalic and atraumatic.  Mouth/Throat: Oropharynx is clear and moist.  Eyes: Conjunctivae and EOM are normal.  Neck: Normal range of motion. Neck supple.  Cardiovascular: Normal rate, regular rhythm and normal heart sounds.   Pulmonary/Chest: Effort normal and breath sounds normal. No respiratory distress.  Musculoskeletal: Normal range of motion. She exhibits no edema.  Neurological: She is alert and oriented to person, place, and time. No sensory deficit.  Skin: Skin is warm and dry.  Psychiatric: Her affect is angry. She is agitated.  Nursing note and vitals reviewed.   ED Course  Procedures (including critical care time) Labs Review Labs Reviewed  CBC - Abnormal; Notable for the following:    RBC 3.25 (*)    Hemoglobin 10.0 (*)    HCT 30.5 (*)    All other components within normal limits  COMPREHENSIVE METABOLIC PANEL - Abnormal; Notable for the following:    Creatinine, Ser 1.25 (*)    GFR calc non Af Amer 49 (*)    GFR calc Af Amer 56 (*)    All other components within normal limits  ETHANOL  URINALYSIS, ROUTINE W REFLEX MICROSCOPIC (NOT AT [facility])  URINE RAPID DRUG SCREEN, HOSP PERFORMED  CBG MONITORING, ED  I-STAT CG4 LACTIC ACID, ED  CBG MONITORING, ED  CBG MONITORING, ED  I-STAT CG4 LACTIC ACID, ED    Imaging Review No results found. I have personally reviewed and evaluated these images and lab results as part of my medical decision-making.   EKG Interpretation None      MDM   Final diagnoses:  Hypoglycemia   Nontoxic appearing, NAD. AF VSS. CBG 69 on arrival. AAO 3 but uncooperative. Blood glucose on CMP 80. Labs were obtained, EtOH and UDS added on given patient's agitation and anger towards staff. She has full mental capacity and does not appear to be altered. CBC and CMP have resulted, awaiting the results, patient left AGAINST MEDICAL  ADVICE stating that she can take care of herself and will record her "fucking blood sugar levels". CBG when leaving 90.  Kathrynn Speed, PA-C 02/21/15 1940  Richardean Canal, MD 02/21/15 2329

## 2015-02-21 NOTE — ED Notes (Signed)
Family called back saying they were at their sons football game and they were not leaving at this time to come get the pt, stated they have the pts purse and her insulin and that pt "may bottom out". Charge explained that from the ED standpoint pt has been discharged.

## 2015-02-21 NOTE — ED Notes (Signed)
Bed: WA06 Expected date:  Expected time:  Means of arrival:  Comments: Ems- low blood sugar

## 2015-02-21 NOTE — Discharge Instructions (Signed)
This is how you should be taking your novoLOG insulin:  Insulin aspart (novoLOG) injection 0-15 Units  0-15 Units, Subcutaneous, 3 times daily with meals CBG < 70: implement hypoglycemia protocol  CBG 70 - 120: 0 units  CBG 121 - 150: 2 units  CBG 151 - 200: 3 units  CBG 201 - 250: 5 units  CBG 251 - 300: 8 units  CBG 301 - 350: 11 units  CBG 351 - 400: 15 units  CBG > 400: call MD and obtain STAT lab verification  Be sure to continue monitoring blood sugar daily and keeping a log.  Hyperglycemia Hyperglycemia occurs when the glucose (sugar) in your blood is too high. Hyperglycemia can happen for many reasons, but it most often happens to people who do not know they have diabetes or are not managing their diabetes properly.  CAUSES  Whether you have diabetes or not, there are other causes of hyperglycemia. Hyperglycemia can occur when you have diabetes, but it can also occur in other situations that you might not be as aware of, such as: Diabetes  If you have diabetes and are having problems controlling your blood glucose, hyperglycemia could occur because of some of the following reasons:  Not following your meal plan.  Not taking your diabetes medications or not taking it properly.  Exercising less or doing less activity than you normally do.  Being sick. Pre-diabetes  This cannot be ignored. Before people develop Type 2 diabetes, they almost always have "pre-diabetes." This is when your blood glucose levels are higher than normal, but not yet high enough to be diagnosed as diabetes. Research has shown that some long-term damage to the body, especially the heart and circulatory system, may already be occurring during pre-diabetes. If you take action to manage your blood glucose when you have pre-diabetes, you may delay or prevent Type 2 diabetes from developing. Stress  If you have diabetes, you may be "diet" controlled or on oral medications or insulin to control your diabetes.  However, you may find that your blood glucose is higher than usual in the hospital whether you have diabetes or not. This is often referred to as "stress hyperglycemia." Stress can elevate your blood glucose. This happens because of hormones put out by the body during times of stress. If stress has been the cause of your high blood glucose, it can be followed regularly by your caregiver. That way he/she can make sure your hyperglycemia does not continue to get worse or progress to diabetes. Steroids  Steroids are medications that act on the infection fighting system (immune system) to block inflammation or infection. One side effect can be a rise in blood glucose. Most people can produce enough extra insulin to allow for this rise, but for those who cannot, steroids make blood glucose levels go even higher. It is not unusual for steroid treatments to "uncover" diabetes that is developing. It is not always possible to determine if the hyperglycemia will go away after the steroids are stopped. A special blood test called an A1c is sometimes done to determine if your blood glucose was elevated before the steroids were started. SYMPTOMS  Thirsty.  Frequent urination.  Dry mouth.  Blurred vision.  Tired or fatigue.  Weakness.  Sleepy.  Tingling in feet or leg. DIAGNOSIS  Diagnosis is made by monitoring blood glucose in one or all of the following ways:  A1c test. This is a chemical found in your blood.  Fingerstick blood glucose monitoring.  Laboratory results. TREATMENT  First, knowing the cause of the hyperglycemia is important before the hyperglycemia can be treated. Treatment may include, but is not be limited to:  Education.  Change or adjustment in medications.  Change or adjustment in meal plan.  Treatment for an illness, infection, etc.  More frequent blood glucose monitoring.  Change in exercise plan.  Decreasing or stopping steroids.  Lifestyle changes. HOME CARE  INSTRUCTIONS   Test your blood glucose as directed.  Exercise regularly. Your caregiver will give you instructions about exercise. Pre-diabetes or diabetes which comes on with stress is helped by exercising.  Eat wholesome, balanced meals. Eat often and at regular, fixed times. Your caregiver or nutritionist will give you a meal plan to guide your sugar intake.  Being at an ideal weight is important. If needed, losing as little as 10 to 15 pounds may help improve blood glucose levels. SEEK MEDICAL CARE IF:   You have questions about medicine, activity, or diet.  You continue to have symptoms (problems such as increased thirst, urination, or weight gain). SEEK IMMEDIATE MEDICAL CARE IF:   You are vomiting or have diarrhea.  Your breath smells fruity.  You are breathing faster or slower.  You are very sleepy or incoherent.  You have numbness, tingling, or pain in your feet or hands.  You have chest pain.  Your symptoms get worse even though you have been following your caregiver's orders.  If you have any other questions or concerns. Document Released: 11/13/2000 Document Revised: 08/12/2011 Document Reviewed: 09/16/2011 [facility] Patient Information 2015 [place], [place]. This information is not intended to replace advice given to you by your health care provider. Make sure you discuss any questions you have with your health care provider.

## 2015-02-21 NOTE — ED Provider Notes (Signed)
CSN: [id number]     Arrival date & time 02/21/15  4098 History   First MD Initiated Contact with Patient 02/21/15 2145     Chief Complaint  Patient presents with  . Hyperglycemia     (Consider location/radiation/quality/duration/timing/severity/associated sxs/prior Treatment) HPI Comments: 54 year old female presenting back for evaluation after leaving AGAINST MEDICAL ADVICE earlier today. She was seen earlier today by me for hypoglycemia. States she never left the ED, and when sitting in the waiting room, she realized that she wanted to be evaluated in full. Her sister-in-law is present with her at this time. Sister-in-law is primary caregiver at this time. Within the past month, patient has been in and out of the hospital in [place] and again here at [facility] for DKA. In [place], she also had acute renal failure and required dialysis 2 days a week until her kidney functions were normal again. No longer on dialysis. In [place], she was living with her ex boyfriend and was left on the ground hypoglycemic with a blood sugar less than 20 for over 17 hours and then went to the hospital. After moving to [facility], she was admitted to [facility] for DKA and discharged home, went to establish care with PCP earlier today and had a blood sugar of less than 20. Sister-in-law states that the patient will not allow anyone to manage her medications and she manages them on her own and is not sure if she is taking them correctly. Patient reports taking humalog insulin twice daily and novolog "as needed", however her blood sugars range from 12-500. She is having difficulty controlling her blood sugar. Sister-in-law is also concerned that the patient is very combative in the home towards her and there are 2 young children in the home. At this time patient denies suicidal or homicidal ideations. Sister-in-law states there are many psych issues going on that the patient will not allow evaluation for.  Pt denies any complaints at this time. She is apologizing for her actions earlier in the day and her actions towards staff.  Patient is a 54 y.o. female presenting with hyperglycemia. The history is provided by the patient and a relative.  Hyperglycemia Associated symptoms: no chest pain, no fever, no nausea, no shortness of breath and no vomiting     Past Medical History  Diagnosis Date  . Blood transfusion without reported diagnosis   . Cancer   . COPD (chronic obstructive pulmonary disease)   . Heart attack 02/19/15  . Diabetes mellitus without complication    History reviewed. No pertinent past surgical history. History reviewed. No pertinent family history. Social History  Substance Use Topics  . Smoking status: Former Games developer  . Smokeless tobacco: None  . Alcohol Use: No   OB History    No data available     Review of Systems  Constitutional: Negative for fever.  Respiratory: Negative for shortness of breath.   Cardiovascular: Negative for chest pain.  Gastrointestinal: Negative for nausea and vomiting.  All other systems reviewed and are negative.     Allergies  Review of patient's allergies indicates no known allergies.  Home Medications   Prior to Admission medications   Medication Sig Start Date End Date Taking? Authorizing Provider  amLODipine (NORVASC) 5 MG tablet Take 1 tablet (5 mg total) by mouth daily. 02/16/15  Yes Dorothea Ogle, MD  aspirin EC 81 MG tablet Take 81 mg by mouth daily.   Yes Historical Provider, MD  carvedilol (COREG) 6.25  MG tablet Take 1 tablet (6.25 mg total) by mouth 2 (two) times daily with a meal. 02/16/15  Yes Dorothea Ogle, MD  divalproex (DEPAKOTE ER) 250 MG 24 hr tablet Take 1 tablet (250 mg total) by mouth 2 (two) times daily. 02/16/15  Yes Dorothea Ogle, MD  gabapentin (NEURONTIN) 100 MG capsule Take 1 capsule (100 mg total) by mouth 3 (three) times daily. 02/16/15  Yes Dorothea Ogle, MD  HYDROcodone-acetaminophen (NORCO/VICODIN)  5-325 MG per tablet Take 1-2 tablets by mouth every 4 (four) hours as needed for moderate pain. 02/16/15  Yes Dorothea Ogle, MD  Insulin NPH Human, Isophane, (HUMULIN N [place]) Inject 5 Units into the skin 2 (two) times daily.   Yes Historical Provider, MD  Insulin Regular Human (HUMULIN R IJ) Inject 5 Units as directed at bedtime.   Yes Historical Provider, MD  QUEtiapine (SEROQUEL) 50 MG tablet Take 1 tablet (50 mg total) by mouth at bedtime. 02/16/15  Yes Dorothea Ogle, MD  sertraline (ZOLOFT) 50 MG tablet Take 1 tablet (50 mg total) by mouth daily. 02/16/15  Yes Dorothea Ogle, MD  insulin NPH Human (HUMULIN N,NOVOLIN N) 100 UNIT/ML injection Inject 0.08 mLs (8 Units total) into the skin 2 (two) times daily at 8 am and 10 pm. Patient not taking: Reported on 02/21/2015 02/16/15   Dorothea Ogle, MD   BP 173/71 mmHg  Pulse 80  Temp(Src) 98.1 F (36.7 C) (Oral)  Resp 17  SpO2 96% Physical Exam  Constitutional: She is oriented to person, place, and time. She appears well-developed and well-nourished. No distress.  HENT:  Head: Normocephalic and atraumatic.  Mouth/Throat: Oropharynx is clear and moist.  Eyes: Conjunctivae and EOM are normal.  Neck: Normal range of motion. Neck supple.  Cardiovascular: Normal rate, regular rhythm and normal heart sounds.   Pulmonary/Chest: Effort normal and breath sounds normal. No respiratory distress.  Abdominal: Soft. Bowel sounds are normal. There is no tenderness.  Musculoskeletal: Normal range of motion. She exhibits no edema.  Neurological: She is alert and oriented to person, place, and time. No sensory deficit.  Skin: Skin is warm and dry.  Psychiatric: She has a normal mood and affect. Her behavior is normal. She expresses no homicidal and no suicidal ideation.  Pleasant, cooperative.  Nursing note and vitals reviewed.   ED Course  Procedures (including critical care time) Labs Review Labs Reviewed  CBG MONITORING, ED - Abnormal; Notable for the  following:    Glucose-Capillary 218 (*)    All other components within normal limits    Imaging Review No results found. I have personally reviewed and evaluated these images and lab results as part of my medical decision-making.   EKG Interpretation None      MDM   Final diagnoses:  Hyperglycemia   Non-toxic appearing, NAD. AFVSS. Calm and cooperative during this second visit. Labs from earlier discussed with pt. CBG 218. Logs of patient's blood sugar discussed. She was taking NovoLog "as needed" which is most likely the reason behind patient's significant decreases in blood sugar. Discussed in detail insulin sliding scale and this was outlined on discharge papers. No acute need for admission into the hospital at this time. She is not suicidal homicidal, very calm and cooperative and appreciated. Advised follow-up with PCP within 1-2 days. Stable for discharge. Return precautions given. Patient states understanding of treatment care plan and is agreeable.  Discussed with attending Dr. Silverio Lay who also evaluated patient and agrees with  plan of care.  Kathrynn Speed, PA-C 02/21/15 2329  Richardean Canal, MD 02/21/15 [phone]

## 2015-02-22 ENCOUNTER — Emergency Department (HOSPITAL_COMMUNITY)
Admission: EM | Admit: 2015-02-22 | Discharge: 2015-02-22 | Disposition: A | Discharge status: Home / Self Care | Payer: BLUE CROSS/BLUE SHIELD | Source: Home / Self Care | Attending: Emergency Medicine | Admitting: Emergency Medicine

## 2015-02-22 ENCOUNTER — Emergency Department (HOSPITAL_COMMUNITY): Payer: BLUE CROSS/BLUE SHIELD

## 2015-02-22 ENCOUNTER — Encounter (HOSPITAL_COMMUNITY): Payer: Self-pay | Admitting: *Deleted

## 2015-02-22 DIAGNOSIS — Z79899 Other long term (current) drug therapy: Secondary | ICD-10-CM | POA: Insufficient documentation

## 2015-02-22 DIAGNOSIS — J449 Chronic obstructive pulmonary disease, unspecified: Secondary | ICD-10-CM | POA: Insufficient documentation

## 2015-02-22 DIAGNOSIS — E11649 Type 2 diabetes mellitus with hypoglycemia without coma: Secondary | ICD-10-CM | POA: Insufficient documentation

## 2015-02-22 DIAGNOSIS — R569 Unspecified convulsions: Secondary | ICD-10-CM

## 2015-02-22 DIAGNOSIS — Z87891 Personal history of nicotine dependence: Secondary | ICD-10-CM | POA: Insufficient documentation

## 2015-02-22 DIAGNOSIS — Z794 Long term (current) use of insulin: Secondary | ICD-10-CM | POA: Insufficient documentation

## 2015-02-22 DIAGNOSIS — Z7982 Long term (current) use of aspirin: Secondary | ICD-10-CM | POA: Insufficient documentation

## 2015-02-22 DIAGNOSIS — Z859 Personal history of malignant neoplasm, unspecified: Secondary | ICD-10-CM | POA: Insufficient documentation

## 2015-02-22 DIAGNOSIS — F319 Bipolar disorder, unspecified: Secondary | ICD-10-CM | POA: Insufficient documentation

## 2015-02-22 DIAGNOSIS — Z8674 Personal history of sudden cardiac arrest: Secondary | ICD-10-CM | POA: Insufficient documentation

## 2015-02-22 DIAGNOSIS — E162 Hypoglycemia, unspecified: Secondary | ICD-10-CM

## 2015-02-22 HISTORY — DX: Alcohol abuse, uncomplicated: F10.10

## 2015-02-22 HISTORY — DX: Bipolar disorder, unspecified: F31.9

## 2015-02-22 LAB — URINALYSIS, ROUTINE W REFLEX MICROSCOPIC
BILIRUBIN URINE: NEGATIVE
GLUCOSE, UA: 250 mg/dL — AB
Hgb urine dipstick: NEGATIVE
KETONES UR: NEGATIVE mg/dL
LEUKOCYTES UA: NEGATIVE
Nitrite: NEGATIVE
PROTEIN: NEGATIVE mg/dL
Specific Gravity, Urine: 1.009 (ref 1.005–1.030)
Urobilinogen, UA: 0.2 mg/dL (ref 0.0–1.0)
pH: 6.5 (ref 5.0–8.0)

## 2015-02-22 LAB — CBG MONITORING, ED
Glucose-Capillary: 165 mg/dL — ABNORMAL HIGH (ref 65–99)
Glucose-Capillary: 288 mg/dL — ABNORMAL HIGH (ref 65–99)

## 2015-02-22 LAB — BASIC METABOLIC PANEL
ANION GAP: 10 (ref 5–15)
BUN: 15 mg/dL (ref 6–20)
CALCIUM: 8.9 mg/dL (ref 8.9–10.3)
CHLORIDE: 101 mmol/L (ref 101–111)
CO2: 29 mmol/L (ref 22–32)
Creatinine, Ser: 1.58 mg/dL — ABNORMAL HIGH (ref 0.44–1.00)
GFR calc non Af Amer: 37 mL/min — ABNORMAL LOW (ref >60)
GFR, EST AFRICAN AMERICAN: 42 mL/min — AB (ref >60)
Glucose, Bld: 211 mg/dL — ABNORMAL HIGH (ref 65–99)
Potassium: 4.1 mmol/L (ref 3.5–5.1)
Sodium: 140 mmol/L (ref 135–145)

## 2015-02-22 LAB — I-STAT TROPONIN, ED: Troponin i, poc: 0 ng/mL (ref 0.00–0.08)

## 2015-02-22 LAB — CBC
HCT: 30.3 % — ABNORMAL LOW (ref 36.0–46.0)
Hemoglobin: 9.9 g/dL — ABNORMAL LOW (ref 12.0–15.0)
MCH: 30.5 pg (ref 26.0–34.0)
MCHC: 32.7 g/dL (ref 30.0–36.0)
MCV: 93.2 fL (ref 78.0–100.0)
Platelets: 330 10*3/uL (ref 150–400)
RBC: 3.25 MIL/uL — AB (ref 3.87–5.11)
RDW: 14.4 % (ref 11.5–15.5)
WBC: 8.9 10*3/uL (ref 4.0–10.5)

## 2015-02-22 NOTE — ED Notes (Signed)
Bed: RESA Expected date:  Expected time:  Means of arrival:  Comments: Ems- CBG low and seizures

## 2015-02-22 NOTE — ED Notes (Signed)
Upon entering pt's room pt refuses to talk to this Clinical research associate; I ask pt what happened that brought her to the ER today and pt has no response; pt finally states that I should talk to the people that are taking care of her; pt states that she does not take care of herself and that her family does; pt EMS pt was home alone and family came home and found pt postictal and hypoglycemic; EMS reports that initial blood sugar was 24 after D25 was given

## 2015-02-22 NOTE — ED Provider Notes (Signed)
CSN: [id number]     Arrival date & time 02/22/15  1924 History   First MD Initiated Contact with Patient 02/22/15 1939     Chief Complaint  Patient presents with  . Hypoglycemia  . Seizures     (Consider location/radiation/quality/duration/timing/severity/associated sxs/prior Treatment) HPI Comments: Here with seizure and found to be hypoglycemic by EMS. Yesterday seen for hypoglycemia also. She was taking her insulin as needed. 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  Patient is a 54 y.o. female presenting with hypoglycemia and seizures. The history is provided by the patient.  Hypoglycemia Initial blood sugar:  24 Blood sugar after intervention:  165 Severity:  Moderate Onset quality:  Gradual Timing:  Constant Progression:  Improving Chronicity:  Chronic Diabetic status:  Controlled with insulin Current diabetic therapy:  SSI insulin Associated symptoms: seizures   Associated symptoms: no shortness of breath and no vomiting   Seizures   Past Medical History  Diagnosis Date  . Blood transfusion without reported diagnosis   . Cancer   . COPD (chronic obstructive pulmonary disease)   . Heart attack 02/19/15  . Diabetes mellitus without complication   . ETOH abuse   . Bipolar 1 disorder    History reviewed. No pertinent past surgical history. No family history on file. Social History  Substance Use Topics  . Smoking status: Former Games developer  . Smokeless tobacco: None  . Alcohol Use: No   OB History    No data available     Review of Systems  Constitutional: Negative for fever.  Respiratory: Negative for cough and shortness of breath.   Gastrointestinal: Negative for vomiting and abdominal pain.  Neurological: Positive for seizures.  All other systems reviewed and are negative.     Allergies  Review of patient's allergies indicates no known allergies.  Home Medications   Prior to Admission medications   Medication Sig Start Date End  Date Taking? Authorizing Provider  amLODipine (NORVASC) 5 MG tablet Take 1 tablet (5 mg total) by mouth daily. 02/16/15   Dorothea Ogle, MD  aspirin EC 81 MG tablet Take 81 mg by mouth daily.    Historical Provider, MD  carvedilol (COREG) 6.25 MG tablet Take 1 tablet (6.25 mg total) by mouth 2 (two) times daily with a meal. 02/16/15   Dorothea Ogle, MD  divalproex (DEPAKOTE ER) 250 MG 24 hr tablet Take 1 tablet (250 mg total) by mouth 2 (two) times daily. 02/16/15   Dorothea Ogle, MD  gabapentin (NEURONTIN) 100 MG capsule Take 1 capsule (100 mg total) by mouth 3 (three) times daily. 02/16/15   Dorothea Ogle, MD  HYDROcodone-acetaminophen (NORCO/VICODIN) 5-325 MG per tablet Take 1-2 tablets by mouth every 4 (four) hours as needed for moderate pain. 02/16/15   Dorothea Ogle, MD  insulin NPH Human (HUMULIN N,NOVOLIN N) 100 UNIT/ML injection Inject 0.08 mLs (8 Units total) into the skin 2 (two) times daily at 8 am and 10 pm. Patient not taking: Reported on 02/21/2015 02/16/15   Dorothea Ogle, MD  Insulin NPH Human, Isophane, (HUMULIN N [place]) Inject 5 Units into the skin 2 (two) times daily.    Historical Provider, MD  Insulin Regular Human (HUMULIN R IJ) Inject 5 Units as directed at bedtime.    Historical Provider, MD  QUEtiapine (SEROQUEL) 50 MG tablet Take 1 tablet (50 mg total) by mouth at bedtime. 02/16/15   Dorothea Ogle, MD  sertraline (ZOLOFT) 50 MG  tablet Take 1 tablet (50 mg total) by mouth daily. 02/16/15   Dorothea Ogle, MD   BP 111/51 mmHg  Pulse 69  Temp(Src) 98 F (36.7 C) (Oral)  Resp 18  SpO2 99% Physical Exam  Constitutional: She is oriented to person, place, and time. She appears well-developed and well-nourished. No distress.  HENT:  Head: Normocephalic and atraumatic.  Mouth/Throat: Oropharynx is clear and moist.  Eyes: EOM are normal. Pupils are equal, round, and reactive to light.  Neck: Normal range of motion. Neck supple.  Cardiovascular: Normal rate and regular rhythm.  Exam  reveals no friction rub.   No murmur heard. Pulmonary/Chest: Effort normal and breath sounds normal. No respiratory distress. She has no wheezes. She has no rales.  Abdominal: Soft. She exhibits no distension. There is no tenderness. There is no rebound.  Musculoskeletal: Normal range of motion. She exhibits no edema.  Neurological: She is alert and oriented to person, place, and time.  Skin: No rash noted. She is not diaphoretic.  Nursing note and vitals reviewed.   ED Course  Procedures (including critical care time) Labs Review Labs Reviewed  CBC - Abnormal; Notable for the following:    RBC 3.25 (*)    Hemoglobin 9.9 (*)    HCT 30.3 (*)    All other components within normal limits  BASIC METABOLIC PANEL - Abnormal; Notable for the following:    Glucose, Bld 211 (*)    Creatinine, Ser 1.58 (*)    GFR calc non Af Amer 37 (*)    GFR calc Af Amer 42 (*)    All other components within normal limits  URINALYSIS, ROUTINE W REFLEX MICROSCOPIC (NOT AT [facility]) - Abnormal; Notable for the following:    APPearance CLOUDY (*)    Glucose, UA 250 (*)    All other components within normal limits  CBG MONITORING, ED - Abnormal; Notable for the following:    Glucose-Capillary 165 (*)    All other components within normal limits  CBG MONITORING, ED - Abnormal; Notable for the following:    Glucose-Capillary 288 (*)    All other components within normal limits  I-STAT TROPOININ, ED    Imaging Review No results found. I have personally reviewed and evaluated these images and lab results as part of my medical decision-making.   EKG Interpretation None      MDM   Final diagnoses:  Hypoglycemia  Seizure    54 year old female here with hypoglycemia and seizure. Seen yesterday for hypoglycemia after she went to PCP for follow-up and was found to be disoriented. She left AGAINST MEDICAL ADVICE and then check back in the ED for full evaluation. She is given a sliding scale to help with  her diabetes. She has been a brittle diabetic for years and was recently admitted for DKA 1 week ago. Here she had a seizure today. No seizure activity on my exam. CBG was 24 at home. She responded well to glucose with EMS. Here vitals are stable. She is belligerent and will not answer my questions. She did take her sliding scale today but cannot tell me when or how much. We'll check labs.  Patient came around and was extremely nice much later in the visit. I believe she was just coming off a low blood sugar was not feeling well. She stated that she had never been on a sliding scale insulin before and took 14 units of insulin today after her blood sugar was around 300. She then  had a seizure a little while later. I feel like that sliding scale is aggressive and instructed her to cut back her insulin but 50-60% on the sliding scale. She feels comfortable going home. She does not want to stable not allow me to admit her. She has been a diabetic for a long time and feels comfortable managing her blood sugar. Stable for discharge   Elwin Mocha, MD 02/22/15 2314

## 2015-02-22 NOTE — Discharge Instructions (Signed)
Please cut down your sliding scale insulin by 50-60%.  Hypoglycemia Hypoglycemia occurs when the glucose in your blood is too low. Glucose is a type of sugar that is your body's main energy source. Hormones, such as insulin and glucagon, control the level of glucose in the blood. Insulin lowers blood glucose and glucagon increases blood glucose. Having too much insulin in your blood stream, or not eating enough food containing sugar, can result in hypoglycemia. Hypoglycemia can happen to people with or without diabetes. It can develop quickly and can be a medical emergency.  CAUSES   Missing or delaying meals.  Not eating enough carbohydrates at meals.  Taking too much diabetes medicine.  Not timing your oral diabetes medicine or insulin doses with meals, snacks, and exercise.  Nausea and vomiting.  Certain medicines.  Severe illnesses, such as hepatitis, kidney disorders, and certain eating disorders.  Increased activity or exercise without eating something extra or adjusting medicines.  Drinking too much alcohol.  A nerve disorder that affects body functions like your heart rate, blood pressure, and digestion (autonomic neuropathy).  A condition where the stomach muscles do not function properly (gastroparesis). Therefore, medicines and food may not absorb properly.  Rarely, a tumor of the pancreas can produce too much insulin. SYMPTOMS   Hunger.  Sweating (diaphoresis).  Change in body temperature.  Shakiness.  Headache.  Anxiety.  Lightheadedness.  Irritability.  Difficulty concentrating.  Dry mouth.  Tingling or numbness in the hands or feet.  Restless sleep or sleep disturbances.  Altered speech and coordination.  Change in mental status.  Seizures or prolonged convulsions.  Combativeness.  Drowsiness (lethargic).  Weakness.  Increased heart rate or palpitations.  Confusion.  Pale, gray skin color.  Blurred or double  vision.  Fainting. DIAGNOSIS  A physical exam and medical history will be performed. Your caregiver may make a diagnosis based on your symptoms. Blood tests and other lab tests may be performed to confirm a diagnosis. Once the diagnosis is made, your caregiver will see if your signs and symptoms go away once your blood glucose is raised.  TREATMENT  Usually, you can easily treat your hypoglycemia when you notice symptoms.  Check your blood glucose. If it is less than 70 mg/dl, take one of the following:   3-4 glucose tablets.    cup juice.    cup regular soda.   1 cup skim milk.   -1 tube of glucose gel.   5-6 hard candies.   Avoid high-fat drinks or food that may delay a rise in blood glucose levels.  Do not take more than the recommended amount of sugary foods, drinks, gel, or tablets. Doing so will cause your blood glucose to go too high.   Wait 10-15 minutes and recheck your blood glucose. If it is still less than 70 mg/dl or below your target range, repeat treatment.   Eat a snack if it is more than 1 hour until your next meal.  There may be a time when your blood glucose may go so low that you are unable to treat yourself at home when you start to notice symptoms. You may need someone to help you. You may even faint or be unable to swallow. If you cannot treat yourself, someone will need to bring you to the hospital.  HOME CARE INSTRUCTIONS  If you have diabetes, follow your diabetes management plan by:  Taking your medicines as directed.  Following your exercise plan.  Following your meal plan. Do  not skip meals. Eat on time.  Testing your blood glucose regularly. Check your blood glucose before and after exercise. If you exercise longer or different than usual, be sure to check blood glucose more frequently.  Wearing your medical alert jewelry that says you have diabetes.  Identify the cause of your hypoglycemia. Then, develop ways to prevent the  recurrence of hypoglycemia.  Do not take a hot bath or shower right after an insulin shot.  Always carry treatment with you. Glucose tablets are the easiest to carry.  If you are going to drink alcohol, drink it only with meals.  Tell friends or family members ways to keep you safe during a seizure. This may include removing hard or sharp objects from the area or turning you on your side.  Maintain a healthy weight. SEEK MEDICAL CARE IF:   You are having problems keeping your blood glucose in your target range.  You are having frequent episodes of hypoglycemia.  You feel you might be having side effects from your medicines.  You are not sure why your blood glucose is dropping so low.  You notice a change in vision or a new problem with your vision. SEEK IMMEDIATE MEDICAL CARE IF:   Confusion develops.  A change in mental status occurs.  The inability to swallow develops.  Fainting occurs. Document Released: 05/20/2005 Document Revised: 05/25/2013 Document Reviewed: 09/16/2011 [facility] Patient Information 2015 [place], [place]. This information is not intended to replace advice given to you by your health care provider. Make sure you discuss any questions you have with your health care provider.

## 2015-02-22 NOTE — ED Notes (Addendum)
Pt transported from her sister in laws home after being found having seizure like activity @1745 . Pt does have hx of same. Initial CBG for family "low" after Dextrose 25 by EMS CBG 24, recheck after an additional D50 204. Pt post ictal per EMS, disoriented, found to be aggressive with care measures. No tongue trauma or urinary incontinence noted. Pt has been seen for several days for  BS issues. #20 R FA est by EMS. Pt received NS , Haldol 5mg , Versed 5mg .

## 2015-02-22 NOTE — Progress Notes (Addendum)
Patient noted to be have been admitted and discharged from hospital from 09/10 to 09/15 for DKA.  Patient presents to ED today with hypoglycemia.  Patient was seen in the ED yesterday for hypoglycemia and left AMA.  EDCM spoke to patient at bedside.  Patient reports she lives with family.  Patient stated, "I did what they told me to do and now I'm here.  I took the sliding scale."  Patient reports she ate a bowel of cereal in the morning, checked her sugar and gave her self insulin and then ate a ham sandwich for lunch, checked her sugar and gave herself insulin.  Patient reports, "I don't know what happened at dinner, that's when she called the men."  Patient reports she draws up and injects her own insulin.  EDCM discussed patient with EDP and EDRN.  Patient may be drawing up and injecting herself with too much insulin.  EDCM suggested having patient draw up insulin in the ED to prove she can do it herself.  Patient has [facility] for September 29th at 11am.

## 2015-02-24 ENCOUNTER — Emergency Department (HOSPITAL_COMMUNITY): Payer: BLUE CROSS/BLUE SHIELD

## 2015-02-24 ENCOUNTER — Inpatient Hospital Stay (HOSPITAL_COMMUNITY)
Admission: EM | Admit: 2015-02-24 | Discharge: 2015-03-02 | DRG: 637 | Disposition: A | Discharge status: Home / Self Care | Payer: BLUE CROSS/BLUE SHIELD | Attending: Internal Medicine | Admitting: Internal Medicine

## 2015-02-24 ENCOUNTER — Inpatient Hospital Stay (HOSPITAL_COMMUNITY): Payer: BLUE CROSS/BLUE SHIELD

## 2015-02-24 ENCOUNTER — Encounter (HOSPITAL_COMMUNITY): Payer: Self-pay | Admitting: Emergency Medicine

## 2015-02-24 DIAGNOSIS — G9340 Encephalopathy, unspecified: Secondary | ICD-10-CM | POA: Diagnosis not present

## 2015-02-24 DIAGNOSIS — E1065 Type 1 diabetes mellitus with hyperglycemia: Secondary | ICD-10-CM | POA: Diagnosis present

## 2015-02-24 DIAGNOSIS — E876 Hypokalemia: Secondary | ICD-10-CM | POA: Diagnosis present

## 2015-02-24 DIAGNOSIS — F319 Bipolar disorder, unspecified: Secondary | ICD-10-CM | POA: Diagnosis present

## 2015-02-24 DIAGNOSIS — G9341 Metabolic encephalopathy: Secondary | ICD-10-CM | POA: Diagnosis present

## 2015-02-24 DIAGNOSIS — F1010 Alcohol abuse, uncomplicated: Secondary | ICD-10-CM | POA: Diagnosis present

## 2015-02-24 DIAGNOSIS — Z794 Long term (current) use of insulin: Secondary | ICD-10-CM

## 2015-02-24 DIAGNOSIS — D638 Anemia in other chronic diseases classified elsewhere: Secondary | ICD-10-CM | POA: Diagnosis present

## 2015-02-24 DIAGNOSIS — Z87891 Personal history of nicotine dependence: Secondary | ICD-10-CM

## 2015-02-24 DIAGNOSIS — I10 Essential (primary) hypertension: Secondary | ICD-10-CM | POA: Diagnosis present

## 2015-02-24 DIAGNOSIS — E119 Type 2 diabetes mellitus without complications: Secondary | ICD-10-CM | POA: Diagnosis not present

## 2015-02-24 DIAGNOSIS — I5032 Chronic diastolic (congestive) heart failure: Secondary | ICD-10-CM | POA: Diagnosis present

## 2015-02-24 DIAGNOSIS — R4182 Altered mental status, unspecified: Secondary | ICD-10-CM | POA: Diagnosis not present

## 2015-02-24 DIAGNOSIS — I340 Nonrheumatic mitral (valve) insufficiency: Secondary | ICD-10-CM | POA: Diagnosis present

## 2015-02-24 DIAGNOSIS — R404 Transient alteration of awareness: Secondary | ICD-10-CM

## 2015-02-24 DIAGNOSIS — IMO0002 Reserved for concepts with insufficient information to code with codable children: Secondary | ICD-10-CM | POA: Diagnosis present

## 2015-02-24 DIAGNOSIS — J449 Chronic obstructive pulmonary disease, unspecified: Secondary | ICD-10-CM | POA: Diagnosis present

## 2015-02-24 DIAGNOSIS — F39 Unspecified mood [affective] disorder: Secondary | ICD-10-CM | POA: Diagnosis not present

## 2015-02-24 DIAGNOSIS — I252 Old myocardial infarction: Secondary | ICD-10-CM

## 2015-02-24 DIAGNOSIS — E1042 Type 1 diabetes mellitus with diabetic polyneuropathy: Secondary | ICD-10-CM | POA: Diagnosis present

## 2015-02-24 DIAGNOSIS — E109 Type 1 diabetes mellitus without complications: Secondary | ICD-10-CM

## 2015-02-24 HISTORY — DX: Type 2 diabetes mellitus with ketoacidosis without coma: E11.10

## 2015-02-24 HISTORY — DX: Unspecified convulsions: R56.9

## 2015-02-24 HISTORY — DX: Type 1 diabetes mellitus without complications: E10.9

## 2015-02-24 HISTORY — DX: Type 2 diabetes mellitus with other specified complication: E11.69

## 2015-02-24 HISTORY — DX: Altered mental status, unspecified: R41.82

## 2015-02-24 HISTORY — DX: Anemia, unspecified: D64.9

## 2015-02-24 HISTORY — DX: Acute kidney failure, unspecified: N17.9

## 2015-02-24 LAB — COMPREHENSIVE METABOLIC PANEL
ALBUMIN: 3.3 g/dL — AB (ref 3.5–5.0)
ALK PHOS: 85 U/L (ref 38–126)
ALT: 17 U/L (ref 14–54)
AST: 24 U/L (ref 15–41)
Anion gap: 11 (ref 5–15)
BUN: 15 mg/dL (ref 6–20)
CALCIUM: 9.1 mg/dL (ref 8.9–10.3)
CO2: 24 mmol/L (ref 22–32)
CREATININE: 1.3 mg/dL — AB (ref 0.44–1.00)
Chloride: 103 mmol/L (ref 101–111)
GFR calc non Af Amer: 46 mL/min — ABNORMAL LOW (ref >60)
GFR, EST AFRICAN AMERICAN: 54 mL/min — AB (ref >60)
GLUCOSE: 202 mg/dL — AB (ref 65–99)
Potassium: 3.9 mmol/L (ref 3.5–5.1)
SODIUM: 138 mmol/L (ref 135–145)
Total Bilirubin: <0.1 mg/dL — ABNORMAL LOW (ref 0.3–1.2)
Total Protein: 6.6 g/dL (ref 6.5–8.1)

## 2015-02-24 LAB — CBC WITH DIFFERENTIAL/PLATELET
BASOS PCT: 0 %
Basophils Absolute: 0 10*3/uL (ref 0.0–0.1)
EOS PCT: 2 %
Eosinophils Absolute: 0.2 10*3/uL (ref 0.0–0.7)
HEMATOCRIT: 27.5 % — AB (ref 36.0–46.0)
Hemoglobin: 9.3 g/dL — ABNORMAL LOW (ref 12.0–15.0)
LYMPHS ABS: 3.8 10*3/uL (ref 0.7–4.0)
Lymphocytes Relative: 32 %
MCH: 31.3 pg (ref 26.0–34.0)
MCHC: 33.8 g/dL (ref 30.0–36.0)
MCV: 92.6 fL (ref 78.0–100.0)
MONOS PCT: 10 %
Monocytes Absolute: 1.2 10*3/uL — ABNORMAL HIGH (ref 0.1–1.0)
NEUTROS ABS: 6.8 10*3/uL (ref 1.7–7.7)
Neutrophils Relative %: 56 %
Platelets: 299 10*3/uL (ref 150–400)
RBC: 2.97 MIL/uL — ABNORMAL LOW (ref 3.87–5.11)
RDW: 14.4 % (ref 11.5–15.5)
WBC: 12 10*3/uL — ABNORMAL HIGH (ref 4.0–10.5)

## 2015-02-24 LAB — I-STAT VENOUS BLOOD GAS, ED
ACID-BASE EXCESS: 2 mmol/L (ref 0.0–2.0)
BICARBONATE: 25.8 meq/L — AB (ref 20.0–24.0)
O2 Saturation: 88 %
PH VEN: 7.455 — AB (ref 7.250–7.300)
TCO2: 27 mmol/L (ref 0–100)
pCO2, Ven: 36.8 mmHg — ABNORMAL LOW (ref 45.0–50.0)
pO2, Ven: 52 mmHg — ABNORMAL HIGH (ref 30.0–45.0)

## 2015-02-24 LAB — TROPONIN I: Troponin I: <0.03 ng/mL (ref <0.031)

## 2015-02-24 LAB — GLUCOSE, CAPILLARY: GLUCOSE-CAPILLARY: 276 mg/dL — AB (ref 65–99)

## 2015-02-24 LAB — ETHANOL

## 2015-02-24 LAB — CBG MONITORING, ED
GLUCOSE-CAPILLARY: 276 mg/dL — AB (ref 65–99)
Glucose-Capillary: 186 mg/dL — ABNORMAL HIGH (ref 65–99)

## 2015-02-24 LAB — TSH: TSH: 0.89 u[IU]/mL (ref 0.350–4.500)

## 2015-02-24 LAB — VALPROIC ACID LEVEL: Valproic Acid Lvl: 41 ug/mL — ABNORMAL LOW (ref 50.0–100.0)

## 2015-02-24 LAB — I-STAT CG4 LACTIC ACID, ED: Lactic Acid, Venous: 0.97 mmol/L (ref 0.5–2.0)

## 2015-02-24 LAB — LIPASE, BLOOD: Lipase: 25 U/L (ref 22–51)

## 2015-02-24 MED ORDER — SODIUM CHLORIDE 0.9 % IV SOLN: INTRAVENOUS | Status: DC

## 2015-02-24 MED ORDER — ASPIRIN EC 81 MG PO TBEC
81.0000 mg | DELAYED_RELEASE_TABLET | Freq: Every day | ORAL | Status: DC
  Administered 2015-02-25 – 2015-03-02 (×6): 81 mg via ORAL
  Filled 2015-02-24 (×6): qty 1

## 2015-02-24 MED ORDER — INSULIN NPH (HUMAN) (ISOPHANE) 100 UNIT/ML ~~LOC~~ SUSP
5.0000 [IU] | Freq: Two times a day (BID) | SUBCUTANEOUS | Status: DC
  Administered 2015-02-24 – 2015-02-25 (×3): 5 [IU] via SUBCUTANEOUS

## 2015-02-24 MED ORDER — ACETAMINOPHEN 325 MG PO TABS: 650.0000 mg | ORAL_TABLET | Freq: Four times a day (QID) | ORAL | Status: DC | PRN

## 2015-02-24 MED ORDER — SODIUM CHLORIDE 0.9 % IV SOLN
INTRAVENOUS | Status: AC
  Administered 2015-02-24: 21:00:00 via INTRAVENOUS

## 2015-02-24 MED ORDER — SODIUM CHLORIDE 0.9 % IJ SOLN
3.0000 mL | Freq: Two times a day (BID) | INTRAMUSCULAR | Status: DC
  Administered 2015-02-25 – 2015-03-02 (×9): 3 mL via INTRAVENOUS

## 2015-02-24 MED ORDER — CARVEDILOL 6.25 MG PO TABS: 6.2500 mg | ORAL_TABLET | Freq: Two times a day (BID) | ORAL | Status: DC

## 2015-02-24 MED ORDER — DIVALPROEX SODIUM ER 250 MG PO TB24: 250.0000 mg | ORAL_TABLET | Freq: Two times a day (BID) | ORAL | Status: DC

## 2015-02-24 MED ORDER — INSULIN ASPART 100 UNIT/ML ~~LOC~~ SOLN
0.0000 [IU] | Freq: Every day | SUBCUTANEOUS | Status: DC
  Administered 2015-02-24 – 2015-02-26 (×2): 3 [IU] via SUBCUTANEOUS
  Administered 2015-02-28: 5 [IU] via SUBCUTANEOUS
  Administered 2015-03-01: 2 [IU] via SUBCUTANEOUS

## 2015-02-24 MED ORDER — CARVEDILOL 6.25 MG PO TABS
6.2500 mg | ORAL_TABLET | Freq: Every day | ORAL | Status: DC
  Administered 2015-02-25 – 2015-03-02 (×6): 6.25 mg via ORAL
  Filled 2015-02-24 (×6): qty 1

## 2015-02-24 MED ORDER — INSULIN ASPART 100 UNIT/ML ~~LOC~~ SOLN
0.0000 [IU] | Freq: Three times a day (TID) | SUBCUTANEOUS | Status: DC
  Administered 2015-02-25: 5 [IU] via SUBCUTANEOUS
  Administered 2015-02-25: 2 [IU] via SUBCUTANEOUS
  Administered 2015-02-25: 3 [IU] via SUBCUTANEOUS
  Administered 2015-02-26: 5 [IU] via SUBCUTANEOUS
  Administered 2015-02-26 – 2015-02-27 (×2): 7 [IU] via SUBCUTANEOUS

## 2015-02-24 MED ORDER — DIVALPROEX SODIUM 250 MG PO DR TAB
250.0000 mg | DELAYED_RELEASE_TABLET | Freq: Two times a day (BID) | ORAL | Status: DC
  Administered 2015-02-24 – 2015-02-26 (×4): 250 mg via ORAL
  Filled 2015-02-24 (×6): qty 1

## 2015-02-24 MED ORDER — HEPARIN SODIUM (PORCINE) 5000 UNIT/ML IJ SOLN
5000.0000 [IU] | Freq: Three times a day (TID) | INTRAMUSCULAR | Status: DC
  Administered 2015-02-24 – 2015-03-02 (×10): 5000 [IU] via SUBCUTANEOUS
  Filled 2015-02-24 (×12): qty 1

## 2015-02-24 MED ORDER — AMLODIPINE BESYLATE 5 MG PO TABS: 5.0000 mg | ORAL_TABLET | Freq: Every day | ORAL | Status: DC

## 2015-02-24 MED ORDER — INSULIN NPH (HUMAN) (ISOPHANE) 100 UNIT/ML ~~LOC~~ SUSP
5.0000 [IU] | Freq: Every day | SUBCUTANEOUS | Status: DC
  Filled 2015-02-24: qty 10

## 2015-02-24 MED ORDER — GABAPENTIN 100 MG PO CAPS
100.0000 mg | ORAL_CAPSULE | Freq: Three times a day (TID) | ORAL | Status: DC
  Administered 2015-02-24 – 2015-02-26 (×5): 100 mg via ORAL
  Filled 2015-02-24 (×5): qty 1

## 2015-02-24 MED ORDER — INSULIN REGULAR HUMAN 100 UNIT/ML IJ SOLN: 5.0000 [IU] | Freq: Every day | INTRAMUSCULAR | Status: DC

## 2015-02-24 MED ORDER — INSULIN ASPART 100 UNIT/ML ~~LOC~~ SOLN: 0.0000 [IU] | Freq: Three times a day (TID) | SUBCUTANEOUS | Status: DC

## 2015-02-24 MED ORDER — QUETIAPINE FUMARATE 25 MG PO TABS
50.0000 mg | ORAL_TABLET | Freq: Every day | ORAL | Status: DC
  Administered 2015-02-24 – 2015-02-25 (×2): 50 mg via ORAL
  Filled 2015-02-24 (×2): qty 2
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
## 2015-02-24 NOTE — ED Notes (Addendum)
Pt's sister in law, [name] is pt's caretaker. [phone]. Pt moved here 1 month ago to live with her sister in law from [place]. She was found  1 month ago lying on the floor at her home and had been there for 2 days. Pt was hypoglycemic and had renal failure at that time. For the last month, she has been having a hard time controlling her blood sugars and has been high and low. Pt gives herself meds, but today her sister in law noticed her pill box still had meds in it, meaning she wasn't taking all of her meds everyday. She has not been officially diagnosed with bipolar but it is suspected and she has appointment with psychiatrist next week.

## 2015-02-24 NOTE — ED Notes (Signed)
EMS reported sister in law called EMS for patient to be seen for hyperglycemia, altered mental status, and to have patient see a endocrinologist. CBG by EMS 276. Patient alert answering questions with ED Doctor at bedside.

## 2015-02-24 NOTE — ED Notes (Signed)
Pt is refusing to be put in a gown. Pt is hooked up to heart monitor, bp and pulse ox.
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
## 2015-02-24 NOTE — Progress Notes (Signed)
Contacted by [facility] regarding this admission.  Patient recently admitted to [facility] so will be transferred to [facility] on 02/25/15.

## 2015-02-24 NOTE — ED Notes (Signed)
Patient transported to CT 

## 2015-02-25 DIAGNOSIS — E1065 Type 1 diabetes mellitus with hyperglycemia: Secondary | ICD-10-CM

## 2015-02-25 DIAGNOSIS — F319 Bipolar disorder, unspecified: Secondary | ICD-10-CM

## 2015-02-25 DIAGNOSIS — E119 Type 2 diabetes mellitus without complications: Secondary | ICD-10-CM

## 2015-02-25 DIAGNOSIS — IMO0002 Reserved for concepts with insufficient information to code with codable children: Secondary | ICD-10-CM

## 2015-02-25 DIAGNOSIS — G9340 Encephalopathy, unspecified: Secondary | ICD-10-CM

## 2015-02-25 DIAGNOSIS — F39 Unspecified mood [affective] disorder: Secondary | ICD-10-CM

## 2015-02-25 LAB — CBC
HEMATOCRIT: 26 % — AB (ref 36.0–46.0)
HEMOGLOBIN: 8.6 g/dL — AB (ref 12.0–15.0)
MCH: 30.7 pg (ref 26.0–34.0)
MCHC: 33.1 g/dL (ref 30.0–36.0)
MCV: 92.9 fL (ref 78.0–100.0)
Platelets: 295 10*3/uL (ref 150–400)
RBC: 2.8 MIL/uL — AB (ref 3.87–5.11)
RDW: 14.5 % (ref 11.5–15.5)
WBC: 8.5 10*3/uL (ref 4.0–10.5)

## 2015-02-25 LAB — GLUCOSE, CAPILLARY
GLUCOSE-CAPILLARY: 209 mg/dL — AB (ref 65–99)
GLUCOSE-CAPILLARY: 183 mg/dL — AB (ref 65–99)
GLUCOSE-CAPILLARY: 278 mg/dL — AB (ref 65–99)
GLUCOSE-CAPILLARY: 464 mg/dL — AB (ref 65–99)
Glucose-Capillary: 504 mg/dL — ABNORMAL HIGH (ref 65–99)

## 2015-02-25 LAB — RAPID URINE DRUG SCREEN, HOSP PERFORMED
Amphetamines: NOT DETECTED
BARBITURATES: NOT DETECTED
BENZODIAZEPINES: POSITIVE — AB
COCAINE: NOT DETECTED
Opiates: POSITIVE — AB
Tetrahydrocannabinol: NOT DETECTED

## 2015-02-25 LAB — URINALYSIS, ROUTINE W REFLEX MICROSCOPIC
BILIRUBIN URINE: NEGATIVE
Glucose, UA: >1000 mg/dL — AB
Hgb urine dipstick: NEGATIVE
KETONES UR: 40 mg/dL — AB
LEUKOCYTES UA: NEGATIVE
NITRITE: NEGATIVE
PH: 5.5 (ref 5.0–8.0)
PROTEIN: NEGATIVE mg/dL
Specific Gravity, Urine: 1.021 (ref 1.005–1.030)
UROBILINOGEN UA: 0.2 mg/dL (ref 0.0–1.0)

## 2015-02-25 LAB — BASIC METABOLIC PANEL
ANION GAP: 13 (ref 5–15)
BUN: 10 mg/dL (ref 6–20)
CO2: 24 mmol/L (ref 22–32)
Calcium: 9.1 mg/dL (ref 8.9–10.3)
Chloride: 103 mmol/L (ref 101–111)
Creatinine, Ser: 1.11 mg/dL — ABNORMAL HIGH (ref 0.44–1.00)
GFR, EST NON AFRICAN AMERICAN: 56 mL/min — AB (ref >60)
GLUCOSE: 240 mg/dL — AB (ref 65–99)
Potassium: 3.9 mmol/L (ref 3.5–5.1)
Sodium: 140 mmol/L (ref 135–145)

## 2015-02-25 LAB — VITAMIN B12: Vitamin B-12: 751 pg/mL (ref 180–914)

## 2015-02-25 LAB — URINE MICROSCOPIC-ADD ON

## 2015-02-25 LAB — GLUCOSE, RANDOM: Glucose, Bld: 505 mg/dL — ABNORMAL HIGH (ref 65–99)

## 2015-02-25 MED ORDER — INSULIN ASPART 100 UNIT/ML ~~LOC~~ SOLN
5.0000 [IU] | Freq: Once | SUBCUTANEOUS | Status: AC
  Administered 2015-02-25: 5 [IU] via SUBCUTANEOUS

## 2015-02-25 NOTE — Progress Notes (Signed)
[facility] PROGRESS NOTE  Kimberly Pena KYH:[id number] DOB: May 02, 1961 DOA: 02/24/2015 PCP: No primary care provider on file.  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 She had ATN requiring hemodialysis temporarily. She was then started on Zoloft, Seroquel and Depakote for bipolar disorder . Patient was brought to the ED for acute encephalopathy for the past 2 days.  Assessment/Plan: Acute encephalopathy Differential includes toxic metabolic encephalopathy secondary to medications (Depakote/Neurontin and Seroquel), hypoglycemic postictal state, ?alcohol withdrawal delirium. Recurrent hypoglycemic episodes during the week. -. Labs ordered for B1, HIV antibody and RPR. Urine drug screen positive for opiates and benzos only. Alcohol level was undetectable. TSH normal. -Check heavy metal screen. -Head CT was unremarkable EEG. -Continue neuro checks and closely monitor her glucose. Avoid narcotics or benzos. -Consult neurology and psychiatry.  Brittle type 1 diabetes. Has type 1 diabetes >30 years with peripheral neuropathy. He was hospitalized from 9/10-9/15 for DKA and acute kidney injury. A1c was 7.8. Her home insulin regimen includes Levemir 10 units at bedtime and Humalog 8 units 3 times a day. She was discharged on multiple and 8 units twice a day with instructions to stop Levemir and Humalog. During outpatient follow-up after hospital discharge she was found to have symptomatic hypoglycemia and was sent to ED where she received IV dextrose and then left AGAINST MEDICAL ADVICE. She was then brought in after 3  hours to the ED with hyperglycemia. Reportedly patient's blood glucose was in the range of 20-400 and patient was taking NovoLog 8-10 units twice a day. The EDP is recommended Humalog 10 units twice a day with sliding scale insulin and was discharged home. Patient then returned to the ED on 9/21 with seizure-like activity with CBG of 25. Patient was found to be agitated. She reported that she was taking sliding scale insulin as instructed and did not miss her meal. Will monitor on sliding scale insulin along with NPH 5 units twice a day. Fingersticks ranging from 180-to 20.Marland Kitchen She will need outpatient endocrinology follow-up for her brittle diabetes. Suspect patient may be missing her meal or taking more insulin than required or instructed.  Alcohol abuse Reportedly issues last drink was 4 weeks back. Monitor on CIWA. Continue thiamine, folate and multivitamin.  Diastolic CHF As per last echo EF of 50% with moderate mitral regurgitation. Continue Coreg. Clinically euvolemic.  Essential hypertension Continue Coreg.  Acute kidney injury Mild. Continue with IV hydration.  DVT prophylaxis: Subcutaneous Lovenox Diet: Diabetic   Code Status: Full code Family Communication: None at bedside Disposition Plan: Continue inpatient monitoring   Consultants:  Psychiatry  Procedures:  None  Antibiotics:  None  HPI/Subjective: Patient seen and examined. Admission H&P reviewed. Appears quite confused.  Objective: Filed Vitals:   02/25/15 0616  BP: 110/40  Pulse: 59  Temp:   Resp:     Intake/Output Summary (Last 24 hours) at 02/25/15 1142 Last data filed at 02/25/15 1026  Gross per 24 hour  Intake   1220 ml  Output    750 ml  Net    470 ml   Filed Weights   02/24/15 1403  Weight: 68.04 kg (150 lb)    Exam:  General:  Middle aged female in no distress  HEENT: No pallor, moist oral mucosa  Chest: Clear to auscultation bilaterally  CVS: Normal S1 and S2, no murmurs or  gallop  GI: Soft, nondistended, nontender, bowel sounds present  Musculoskeletal: Warm, no edema  CNS: Alert and oriented x 2 ( knows the year and date, knows where she lives but has completely disconnect regarding her illness and symptoms),     Data Reviewed: Basic Metabolic Panel:  Recent Labs Lab 02/21/15 1654 02/22/15 1947 02/24/15 1500 02/25/15 0533  NA 138 140 138 140  K 3.6 4.1 3.9 3.9  CL 102 101 103 103  CO2 GLUCOSE 80 211* 202* 240*  BUN CREATININE 1.25* 1.58* 1.30* 1.11*  CALCIUM 9.5 8.9 9.1 9.1   Liver Function Tests:  Recent Labs Lab 02/21/15 1654 02/24/15 1500  AST 29 24  ALT 19 17  ALKPHOS 90 85  BILITOT 0.5 <0.1*  PROT 7.6 6.6  ALBUMIN 3.9 3.3*    Recent Labs Lab 02/24/15 1500  LIPASE 25   No results for input(s): AMMONIA in the last 168 hours. CBC:  Recent Labs Lab 02/21/15 1654 02/22/15 1947 02/24/15 1500 02/25/15 0533  WBC 8.4 8.9 12.0* 8.5  NEUTROABS  --   --  6.8  --   HGB 10.0* 9.9* 9.3* 8.6*  HCT 30.5* 30.3* 27.5* 26.0*  MCV 93.8 93.2 92.6 92.9  PLT 361 330 299 295   Cardiac Enzymes:  Recent Labs Lab 02/24/15 1500  TROPONINI <0.03   BNP (last 3 results) No results for input(s): BNP in the last 8760 hours.  ProBNP (last 3 results) No results for input(s): PROBNP in the last 8760 hours.  CBG:  Recent Labs Lab 02/22/15 2301 02/24/15 1358 02/24/15 1827 02/24/15 2041 02/25/15 0749  GLUCAP 288* 276* 186* 276* 209*    No results found for this or any previous visit (from the past 240 hour(s)).   Studies: Ct Head Wo Contrast  02/24/2015   CLINICAL DATA:  Confusion, hyperglycemia, altered mental status.  EXAM: CT HEAD WITHOUT CONTRAST  TECHNIQUE: Contiguous axial images were obtained from the base of the skull through the vertex without intravenous contrast.  COMPARISON:  None.  FINDINGS: There is mild volume loss throughout the cerebral hemispheres, somewhat age advanced. No acute  intracranial abnormality. Specifically, no hemorrhage, hydrocephalus, mass lesion, acute infarction, or significant intracranial injury. No acute calvarial abnormality. Visualized paranasal sinuses and mastoids clear. Orbital soft tissues unremarkable.  IMPRESSION: No acute intracranial abnormality.  Mild age advanced volume loss/ atrophy.   Electronically Signed   By: Charlett Nose M.D.   On: 02/24/2015 14:54   Portable Chest 1 View  02/25/2015   CLINICAL DATA:  Altered mental status.  EXAM: PORTABLE CHEST 1 VIEW  COMPARISON:  02/11/2015.  FINDINGS: Normal sized heart. Interval mildly elevated left hemidiaphragm. Clear lungs. Normal appearing bones.  IMPRESSION: Interval mild elevation of the left hemidiaphragm. Otherwise, unremarkable examination.   Electronically Signed   By: Beckie Salts M.D.   On: 02/25/2015 02:38    Scheduled Meds: . aspirin EC  81 mg Oral Daily  . carvedilol  6.25 mg Oral Daily  . divalproex  250 mg Oral Q12H  . gabapentin  100 mg Oral TID  . heparin  5,000 Units Subcutaneous 3 times per day  . insulin aspart  0-5 Units Subcutaneous QHS  . insulin aspart  0-9 Units Subcutaneous TID WC  . insulin  NPH Human  5 Units Subcutaneous BID AC & HS  . QUEtiapine  50 mg Oral QHS  . sodium chloride  3 mL Intravenous Q12H   Continuous Infusions:      Time spent:25 minutes    DHUNGEL, NISHANT  [facility] Pager [phone]. If 7PM-7AM, please contact night-coverage at [email or web address], password [facility] 02/25/2015, 11:42 AM  LOS: 1 day

## 2015-02-25 NOTE — Progress Notes (Signed)
Pt is refusing bed alarm. Became very agitated and stated she will not be strapped to a bed. Explained to pt the benefits of the bed alarm and the risks of not having it activated. Pt. does not want to hear any of this. States she wants it off. Bed alarm deactivated.

## 2015-02-25 NOTE — Consult Note (Signed)
[place] [facility] Consult   Reason for Consult:  Anger outburst, mood lability, confusion Referring Physician: Dr. Clementeen Graham Patient Identification: Kimberly Pena MRN:  [id number] Principal Diagnosis: Diabetes mellitus type 1, uncontrolled Diagnosis:   Patient Active Problem List   Diagnosis Date Noted  . Episodic mood disorder [F39] 02/25/2015    Priority: High  . Diabetes mellitus type 1, uncontrolled [E10.65]   . Altered mental status [R41.82] 02/24/2015  . Altered mental state [R41.82] 02/24/2015  . Hypoglycemia due to type 1 diabetes mellitus [E10.649] 02/21/2015  . Diabetic ketoacidosis without coma associated with type 2 diabetes mellitus [E13.10]   . DM neuropathy with neurologic complication [K56.25] 63/89/3734  . Hypokalemia [E87.6] 02/15/2015  . Hypomagnesemia [E83.42] 02/15/2015  . Type I (juvenile type) diabetes mellitus with ketoacidosis, uncontrolled [E10.10]   . Acute kidney failure, unspecified [N17.9]   . Anemia of other chronic disease [D63.8]   . Hyposmolality and/or hyponatremia [E87.1]   . DKA (diabetic ketoacidoses) [E13.10] 02/11/2015  . DKA, type 1 [E10.10] 02/11/2015  . AKI (acute kidney injury) [N17.9] 02/11/2015  . 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 Anemia [D64.9] 02/11/2015  . HTN (hypertension) [I10] 02/11/2015  . Hyperkalemia, transcellular shifts [E87.5] 02/11/2015  . Hyponatremia [E87.1] 02/11/2015    Total Time spent with patient: 1 hour  Subjective:  "I don't need a psychiatrist, I am not crazy.''  HPI: Kimberly Pena is a 54 y.o. female patient who denies previous history of mental illness. History obtained from her mother who raised her revealed that she was diagnosed with Bipolar disorder, also her father agreed that patient suffers from mood swings but will not agree that his daughter has Bipolar disorder, parents were divorced over 30 years ago. Patient reports that she suffers from type 1 diabetes mellitus but  approximately 2 months ago she was in  diabetic coma for which she was admitted to an hospital in [place]. She had a prolonged ICU hospitalization for diabetic coma and unresponsiveness. She had renal failure and rhabdomyolysis at that time and was placed in dialysis. The patient recovered and was brought to stay with her father  and step mother here in the [place] area. They've been helping her with insulin administration and obtaining medical care. Today, patient appears alert and oriented to place, person, time and situation. She denies depression, anxiety, delusional thinking and psychosis. However, she admits to episodic anger and mood swings when she is stressed out or overwhelmed. Patient reports that her  major stressors are her mother and her medical issues. She denies suicidal or homicidal ideations, intent or plan.   HPI Elements:   Location:  episodic anger . Quality:  mild to moderate.  Past Medical History:  Past Medical History  Diagnosis Date  . Blood transfusion without reported diagnosis   . ETOH abuse   . Type I diabetes mellitus   . Anemia   . Diabetic coma 01/2015    prolonged hospitalization [facility] Endo 02/24/2015  . Altered mental status     for the past 2 days/notes 02/24/2015  . Heart attack 02/19/15  . Bipolar 1 disorder     "dx'd in [place] in 01/2015"   . Acute renal failure (ARF) 01/2015    renal failure requiring dialysis, rhabdomyolysis/notes 02/24/2015  . DKA (diabetic ketoacidoses) 01/2015  . Seizures     "related to her low blood sugars"/[name], sister-in-law 02/24/2015    Past Surgical History  Procedure Laterality Date  . Tonsillectomy    . Abdominal  hysterectomy    . Cesarean section     Family History: History reviewed. No pertinent family history. Social History:  History  Alcohol Use  . Yes    Comment: 02/24/2015 "I don't drink that much; don't drink q week"     History  Drug Use No    Social History   Social History  . Marital  Status: Divorced    Spouse Name: N/A  . Number of Children: N/A  . Years of Education: N/A   Social History Main Topics  . Smoking status: Former Smoker -- 1.00 packs/day for 20 years    Types: Cigarettes  . Smokeless tobacco: Never Used     Comment: 02/24/2015 "stopped smoking over 1 yr ago; don't know when I started smoking or how much"  . Alcohol Use: Yes     Comment: 02/24/2015 "I don't drink that much; don't drink q week"  . Drug Use: No  . Sexual Activity: No   Other Topics Concern  . None   Social History Narrative   Additional Social History:                          Allergies:  No Known Allergies  Labs:  Results for orders placed or performed during the hospital encounter of 02/24/15 (from the past 48 hour(s))  CBG monitoring, ED     Status: Abnormal   Collection Time: 02/24/15  1:58 PM  Result Value Ref Range   Glucose-Capillary 276 (H) 65 - 99 mg/dL  Comprehensive metabolic panel     Status: Abnormal   Collection Time: 02/24/15  3:00 PM  Result Value Ref Range   Sodium 138 135 - 145 mmol/L   Potassium 3.9 3.5 - 5.1 mmol/L   Chloride 103 101 - 111 mmol/L   CO2 24 22 - 32 mmol/L   Glucose, Bld 202 (H) 65 - 99 mg/dL   BUN 15 6 - 20 mg/dL   Creatinine, Ser 1.30 (H) 0.44 - 1.00 mg/dL   Calcium 9.1 8.9 - 10.3 mg/dL   Total Protein 6.6 6.5 - 8.1 g/dL   Albumin 3.3 (L) 3.5 - 5.0 g/dL   AST 24 15 - 41 U/L   ALT 17 14 - 54 U/L   Alkaline Phosphatase 85 38 - 126 U/L   Total Bilirubin <0.1 (L) 0.3 - 1.2 mg/dL   GFR calc non Af Amer 46 (L) >60 mL/min   GFR calc Af Amer 54 (L) >60 mL/min    Comment: (NOTE) The eGFR has been calculated using the CKD EPI equation. This calculation has not been validated in all clinical situations. eGFR's persistently <60 mL/min signify possible Chronic Kidney Disease.    Anion gap 11 5 - 15  Lipase, blood     Status: None   Collection Time: 02/24/15  3:00 PM  Result Value Ref Range   Lipase 25 22 - 51 U/L  Troponin I      Status: None   Collection Time: 02/24/15  3:00 PM  Result Value Ref Range   Troponin I <0.03 <0.031 ng/mL    Comment:        NO INDICATION OF MYOCARDIAL INJURY.   CBC with Differential     Status: Abnormal   Collection Time: 02/24/15  3:00 PM  Result Value Ref Range   WBC 12.0 (H) 4.0 - 10.5 K/uL   RBC 2.97 (L) 3.87 - 5.11 MIL/uL   Hemoglobin 9.3 (L) 12.0 - 15.0 g/dL  HCT 27.5 (L) 36.0 - 46.0 %   MCV 92.6 78.0 - 100.0 fL   MCH 31.3 26.0 - 34.0 pg   MCHC 33.8 30.0 - 36.0 g/dL   RDW 14.4 11.5 - 15.5 %   Platelets 299 150 - 400 K/uL   Neutrophils Relative % 56 %   Lymphocytes Relative 32 %   Monocytes Relative 10 %   Eosinophils Relative 2 %   Basophils Relative 0 %   Neutro Abs 6.8 1.7 - 7.7 K/uL   Lymphs Abs 3.8 0.7 - 4.0 K/uL   Monocytes Absolute 1.2 (H) 0.1 - 1.0 K/uL   Eosinophils Absolute 0.2 0.0 - 0.7 K/uL   Basophils Absolute 0.0 0.0 - 0.1 K/uL   RBC Morphology STOMATOCYTES   Valproic acid level     Status: Abnormal   Collection Time: 02/24/15  3:00 PM  Result Value Ref Range   Valproic Acid Lvl 41 (L) 50.0 - 100.0 ug/mL  I-Stat CG4 Lactic Acid, ED     Status: None   Collection Time: 02/24/15  3:17 PM  Result Value Ref Range   Lactic Acid, Venous 0.97 0.5 - 2.0 mmol/L  I-Stat venous blood gas, ED     Status: Abnormal   Collection Time: 02/24/15  4:02 PM  Result Value Ref Range   pH, Ven 7.455 (H) 7.250 - 7.300   pCO2, Ven 36.8 (L) 45.0 - 50.0 mmHg   pO2, Ven 52.0 (H) 30.0 - 45.0 mmHg   Bicarbonate 25.8 (H) 20.0 - 24.0 mEq/L   TCO2 27 0 - 100 mmol/L   O2 Saturation 88.0 %   Acid-Base Excess 2.0 0.0 - 2.0 mmol/L   Patient temperature HIDE    Sample type VENOUS   CBG monitoring, ED     Status: Abnormal   Collection Time: 02/24/15  6:27 PM  Result Value Ref Range   Glucose-Capillary 186 (H) 65 - 99 mg/dL  Glucose, capillary     Status: Abnormal   Collection Time: 02/24/15  8:41 PM  Result Value Ref Range   Glucose-Capillary 276 (H) 65 - 99 mg/dL  TSH      Status: None   Collection Time: 02/24/15  9:35 PM  Result Value Ref Range   TSH 0.890 0.350 - 4.500 uIU/mL  Ethanol     Status: None   Collection Time: 02/24/15  9:35 PM  Result Value Ref Range   Alcohol, Ethyl (B) <5 <5 mg/dL    Comment:        LOWEST DETECTABLE LIMIT FOR SERUM ALCOHOL IS 5 mg/dL FOR MEDICAL PURPOSES ONLY   Urinalysis, Routine w reflex microscopic     Status: Abnormal   Collection Time: 02/25/15 12:41 AM  Result Value Ref Range   Color, Urine YELLOW YELLOW   APPearance CLEAR CLEAR   Specific Gravity, Urine 1.021 1.005 - 1.030   pH 5.5 5.0 - 8.0   Glucose, UA >1000 (A) NEGATIVE mg/dL   Hgb urine dipstick NEGATIVE NEGATIVE   Bilirubin Urine NEGATIVE NEGATIVE   Ketones, ur 40 (A) NEGATIVE mg/dL   Protein, ur NEGATIVE NEGATIVE mg/dL   Urobilinogen, UA 0.2 0.0 - 1.0 mg/dL   Nitrite NEGATIVE NEGATIVE   Leukocytes, UA NEGATIVE NEGATIVE  Urine rapid drug screen (hosp performed)     Status: Abnormal   Collection Time: 02/25/15 12:41 AM  Result Value Ref Range   Opiates POSITIVE (A) NONE DETECTED   Cocaine NONE DETECTED NONE DETECTED   Benzodiazepines POSITIVE (A) NONE DETECTED   Amphetamines  NONE DETECTED NONE DETECTED   Tetrahydrocannabinol NONE DETECTED NONE DETECTED   Barbiturates NONE DETECTED NONE DETECTED    Comment:        DRUG SCREEN FOR MEDICAL PURPOSES ONLY.  IF CONFIRMATION IS NEEDED FOR ANY PURPOSE, NOTIFY LAB WITHIN 5 DAYS.        LOWEST DETECTABLE LIMITS FOR URINE DRUG SCREEN Drug Class       Cutoff (ng/mL) Amphetamine      1000 Barbiturate      200 Benzodiazepine   130 Tricyclics       865 Opiates          300 Cocaine          300 THC              50   Urine microscopic-add on     Status: Abnormal   Collection Time: 02/25/15 12:41 AM  Result Value Ref Range   Squamous Epithelial / LPF FEW (A) RARE   WBC, UA 0-2 <3 WBC/hpf   RBC / HPF 0-2 <3 RBC/hpf   Bacteria, UA RARE RARE   Urine-Other RARE YEAST   Basic metabolic panel      Status: Abnormal   Collection Time: 02/25/15  5:33 AM  Result Value Ref Range   Sodium 140 135 - 145 mmol/L   Potassium 3.9 3.5 - 5.1 mmol/L   Chloride 103 101 - 111 mmol/L   CO2 24 22 - 32 mmol/L   Glucose, Bld 240 (H) 65 - 99 mg/dL   BUN 10 6 - 20 mg/dL   Creatinine, Ser 1.11 (H) 0.44 - 1.00 mg/dL   Calcium 9.1 8.9 - 10.3 mg/dL   GFR calc non Af Amer 56 (L) >60 mL/min   GFR calc Af Amer >60 >60 mL/min    Comment: (NOTE) The eGFR has been calculated using the CKD EPI equation. This calculation has not been validated in all clinical situations. eGFR's persistently <60 mL/min signify possible Chronic Kidney Disease.    Anion gap 13 5 - 15  CBC     Status: Abnormal   Collection Time: 02/25/15  5:33 AM  Result Value Ref Range   WBC 8.5 4.0 - 10.5 K/uL   RBC 2.80 (L) 3.87 - 5.11 MIL/uL   Hemoglobin 8.6 (L) 12.0 - 15.0 g/dL   HCT 26.0 (L) 36.0 - 46.0 %   MCV 92.9 78.0 - 100.0 fL   MCH 30.7 26.0 - 34.0 pg   MCHC 33.1 30.0 - 36.0 g/dL   RDW 14.5 11.5 - 15.5 %   Platelets 295 150 - 400 K/uL  Glucose, capillary     Status: Abnormal   Collection Time: 02/25/15  7:49 AM  Result Value Ref Range   Glucose-Capillary 209 (H) 65 - 99 mg/dL  Vitamin B12     Status: None   Collection Time: 02/25/15  9:50 AM  Result Value Ref Range   Vitamin B-12 751 180 - 914 pg/mL    Comment: (NOTE) This assay is not validated for testing neonatal or myeloproliferative syndrome specimens for Vitamin B12 levels.   Glucose, capillary     Status: Abnormal   Collection Time: 02/25/15 11:47 AM  Result Value Ref Range   Glucose-Capillary 183 (H) 65 - 99 mg/dL    Vitals: Blood pressure 110/40, pulse 59, temperature 98.3 F (36.8 C), temperature source Oral, resp. rate 18, height 5' 7"  (1.702 m), weight 68.04 kg (150 lb), SpO2 97 %.  Risk to Self: Is patient at risk  for suicide?: No Risk to Others:   Prior Inpatient Therapy:   Prior Outpatient Therapy:    Current Facility-Administered Medications   Medication Dose Route Frequency Provider Last Rate Last Dose  . acetaminophen (TYLENOL) tablet 650 mg  650 mg Oral Q6H PRN [place] N Rumley, DO      . aspirin EC tablet 81 mg  81 mg Oral Daily [place] N Rumley, DO   81 mg at 02/25/15 0942  . carvedilol (COREG) tablet 6.25 mg  6.25 mg Oral Daily [facility], DO   6.25 mg at 02/25/15 0942  . divalproex (DEPAKOTE) DR tablet 250 mg  250 mg Oral Q12H Blane Ohara McDiarmid, MD   250 mg at 02/25/15 0950  . gabapentin (NEURONTIN) capsule 100 mg  100 mg Oral TID [facility], DO   100 mg at 02/25/15 0941  . heparin injection 5,000 Units  5,000 Units Subcutaneous 3 times per day Lorna Few, DO   5,000 Units at 02/24/15 2149  . insulin aspart (novoLOG) injection 0-5 Units  0-5 Units Subcutaneous QHS Smiley Houseman, MD   3 Units at 02/24/15 2147  . insulin aspart (novoLOG) injection 0-9 Units  0-9 Units Subcutaneous TID WC Smiley Houseman, MD   3 Units at 02/25/15 0831  . insulin NPH Human (HUMULIN N,NOVOLIN N) injection 5 Units  5 Units Subcutaneous BID AC & HS [place] N Rumley, DO   5 Units at 02/25/15 1749  . QUEtiapine (SEROQUEL) tablet 50 mg  50 mg Oral QHS [place] N Rumley, DO   50 mg at 02/24/15 2233  . sodium chloride 0.9 % injection 3 mL  3 mL Intravenous Q12H [place] N Rumley, DO   3 mL at 02/25/15 1000    Musculoskeletal: Strength & Muscle Tone: within normal limits Gait & Station: normal Patient leans: N/A  Psychiatric Specialty Exam: Physical Exam  Psychiatric: Her speech is normal. Judgment and thought content normal. Her affect is angry and labile. She is agitated and aggressive. Cognition and memory are normal.    Review of Systems  Constitutional: Positive for malaise/fatigue.  HENT: Negative.   Eyes: Negative.   Respiratory: Negative.   Cardiovascular: Negative.   Genitourinary: Negative.   Skin: Negative.   Neurological: Negative for headaches.  Endo/Heme/Allergies: Negative.   Psychiatric/Behavioral:        Agitated, labile    Blood pressure 110/40, pulse 59, temperature 98.3 F (36.8 C), temperature source Oral, resp. rate 18, height 5' 7"  (1.702 m), weight 68.04 kg (150 lb), SpO2 97 %.Body mass index is 23.49 kg/(m^2).  General Appearance: Fairly Groomed  Engineer, water::  Good  Speech:  Clear and Coherent  Volume:  Normal  Mood:  Irritable  Affect:  Appropriate  Thought Process:  Goal Directed  Orientation:  Full (Time, Place, and Person)  Thought Content:  Negative  Suicidal Thoughts:  No  Homicidal Thoughts:  No  Memory:  Immediate;   Good Recent;   Good Remote;   Good  Judgement:  Fair  Insight:  Fair  Psychomotor Activity:  Increased  Concentration:  Fair  Recall:  [place] of Knowledge:Good  Language: Good  Akathisia:  No  Handed:  Right  AIMS (if indicated):     Assets:  Communication Skills Desire for Improvement  ADL's:  Intact  Cognition: WNL  Sleep:   fair   Medical Decision Making: Established Problem, Stable/Improving (1), Review or order clinical lab tests (1), Review of Medication Regimen & Side Effects (  2) and Review of New Medication or Change in Dosage (2)  Treatment Plan Summary: Daily contact with patient to assess and evaluate symptoms and progress in treatment and Medication management   Assessment: Episodic mood disorder R/O Bipolar disorder  Plan:   No evidence of imminent risk to self or others at present.   Patient does not meet criteria for psychiatric inpatient admission. Supportive therapy provided about ongoing stressors. Continue Depakote 240m bid and Neurontin 1057mtid for mood stabilization Please refer patient for counseling upon discharge Disposition: Patient is cleared psychiatrically,pls re-consult psych service if necessary,   AkCorena PilgrimMD 02/25/2015 12:20 PM

## 2015-02-25 NOTE — Progress Notes (Signed)
Inpatient Diabetes Program Recommendations  AACE/ADA: New Consensus Statement on Inpatient Glycemic Control (2015)  Target Ranges:  Prepandial:   less than 140 mg/dL      Peak postprandial:   less than 180 mg/dL (1-2 hours)      Critically ill patients:  140 - 180 mg/dL   Review of Glycemic Control   Outpatient Diabetes medications: NPH 5 units BID, Regular 5 units at HS Current orders for Inpatient glycemic control: NPH 5 units BID, Novolog SENSITIVE correction scale TID & HS  Inpatient Diabetes Program Recommendations:  Received consult. Agree with insulin regimen as ordered in hospital. Will continue to monitor blood sugars in hospital. Will  need to be discharged to PCP or endocrinologist. Smith Mince RN BSN CDE

## 2015-02-26 DIAGNOSIS — IMO0002 Reserved for concepts with insufficient information to code with codable children: Secondary | ICD-10-CM | POA: Diagnosis present

## 2015-02-26 DIAGNOSIS — E109 Type 1 diabetes mellitus without complications: Secondary | ICD-10-CM

## 2015-02-26 DIAGNOSIS — F319 Bipolar disorder, unspecified: Secondary | ICD-10-CM | POA: Insufficient documentation

## 2015-02-26 DIAGNOSIS — F39 Unspecified mood [affective] disorder: Secondary | ICD-10-CM

## 2015-02-26 DIAGNOSIS — G9340 Encephalopathy, unspecified: Secondary | ICD-10-CM | POA: Diagnosis present

## 2015-02-26 DIAGNOSIS — E1065 Type 1 diabetes mellitus with hyperglycemia: Secondary | ICD-10-CM | POA: Diagnosis present

## 2015-02-26 LAB — GLUCOSE, CAPILLARY
GLUCOSE-CAPILLARY: 264 mg/dL — AB (ref 65–99)
GLUCOSE-CAPILLARY: 225 mg/dL — AB (ref 65–99)
GLUCOSE-CAPILLARY: 288 mg/dL — AB (ref 65–99)
Glucose-Capillary: 316 mg/dL — ABNORMAL HIGH (ref 65–99)
Glucose-Capillary: 59 mg/dL — ABNORMAL LOW (ref 65–99)
Glucose-Capillary: 40 mg/dL — CL (ref 65–99)
Glucose-Capillary: 58 mg/dL — ABNORMAL LOW (ref 65–99)
Glucose-Capillary: 73 mg/dL (ref 65–99)

## 2015-02-26 LAB — IRON AND TIBC
IRON: 48 ug/dL (ref 28–170)
Saturation Ratios: 17 % (ref 10.4–31.8)
TIBC: 277 ug/dL (ref 250–450)
UIBC: 229 ug/dL

## 2015-02-26 LAB — HIV ANTIBODY (ROUTINE TESTING W REFLEX): HIV SCREEN 4TH GENERATION: NONREACTIVE

## 2015-02-26 LAB — VALPROIC ACID LEVEL: Valproic Acid Lvl: 23 ug/mL — ABNORMAL LOW (ref 50.0–100.0)

## 2015-02-26 LAB — RPR: RPR: NONREACTIVE

## 2015-02-26 MED ORDER — INSULIN NPH (HUMAN) (ISOPHANE) 100 UNIT/ML ~~LOC~~ SUSP
12.0000 [IU] | Freq: Two times a day (BID) | SUBCUTANEOUS | Status: DC
  Administered 2015-02-26 – 2015-02-27 (×3): 12 [IU] via SUBCUTANEOUS

## 2015-02-26 NOTE — Progress Notes (Addendum)
[facility] PROGRESS NOTE  Kimberly Pena YQI:[id number] DOB: 04/19/1961 DOA: 02/24/2015 PCP: No primary care Kimberly Pena on file.  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 She had ATN requiring hemodialysis temporarily. She was then started on Zoloft, Seroquel and Depakote for bipolar disorder . Patient was brought to the ED for acute encephalopathy for the past 2 days.  Assessment/Plan: Acute encephalopathy Differential includes toxic metabolic encephalopathy secondary to medications (Depakote/Neurontin and Seroquel), hypoglycemic postictal state, ?alcohol withdrawal delirium. Recurrent hypoglycemic episodes during the week. -. Labs ordered for B1. HIV antibody and RPR negative. . Urine drug screen positive for opiates and benzos only. Alcohol level was undetectable. TSH normal. -Check heavy metal screen. -Head CT was unremarkable. Check EEG.  - Avoid narcotics or benzos. -appreciate psych consult . Recommends to conitnue depakote and neurontin for mood stabilization.  -Given her persistent encephalopathy will hold off on all her psych medications for now. Check repeat Depakote level.  Brittle type 1 diabetes. Has type 1 diabetes >30 years with peripheral neuropathy. He was hospitalized from 9/10-9/15 for DKA and acute kidney injury. A1c was 7.8. Her home insulin regimen includes Levemir 10 units at bedtime and Humalog 8 units 3 times a day. She was discharged on multiple and 8 units twice a day with instructions to stop Levemir and Humalog. During outpatient follow-up after hospital discharge she was found to have  symptomatic hypoglycemia and was sent to ED where she received IV dextrose and then left AGAINST MEDICAL ADVICE. She was then brought in after 3 hours to the ED with hyperglycemia. Reportedly patient's blood glucose was in the range of 20-400 and patient was taking NovoLog 8-10 units twice a day. The EDP is recommended Humalog 10 units twice a day with sliding scale insulin and was discharged home. Patient then returned to the ED on 9/21 with seizure-like activity with CBG of 25. Patient was found to be agitated. She reported that she was taking sliding scale insulin as instructed and did not miss her meal. -on NPH 5 units twice a day. History elevated up to 500. We'll increase dose to 14 u bid.Marland Kitchen She will need outpatient endocrinology follow-up for her brittle diabetes. Suspect patient may be missing her meal or taking more insulin than required or instructed. Patient is unable to tell me much insulin she takes at home.  Alcohol abuse Reportedly issues last drink was 4 weeks back. Monitor on CIWA. Continue thiamine, folate and multivitamin.  Diastolic CHF As per last echo EF of 50% with moderate mitral regurgitation. Continue Coreg. Clinically euvolemic.  Essential hypertension Continue Coreg.  Acute kidney injury Mild. Improved IV hydration.  Anemia Check iron panel. B12 normal. Check stool  for occult blood.  DVT prophylaxis: Subcutaneous Lovenox Diet: Diabetic   Code Status: Full code Family Communication: None at bedside Disposition Plan: Continue inpatient monitoring   Consultants:  Psychiatry  Procedures:  None  Antibiotics:  None  HPI/Subjective: Patient seen and examined. Remains confused.  Objective: Filed Vitals:   02/26/15 1318  BP: 110/86  Pulse: 71  Temp: 98.6 F (37 C)  Resp: 20    Intake/Output Summary (Last 24 hours) at 02/26/15 1341 Last data  filed at 02/26/15 0858  Gross per 24 hour  Intake    520 ml  Output    500 ml  Net     20 ml    Filed Weights   02/24/15 1403  Weight: 68.04 kg (150 lb)    Exam:   General: Not in distress, confused  HEENT: No pallor, moist oral mucosa  Chest: Clear to auscultation bilaterally  CVS: Normal S1 and S2, no murmurs or gallop  GI: Soft, nondistended, nontender, bowel sounds present  Musculoskeletal: Warm, no edema  CNS: Alert and oriented x 1   Data Reviewed: Basic Metabolic Panel:  Recent Labs Lab 02/21/15 1654 02/22/15 1947 02/24/15 1500 02/25/15 0533 02/25/15 2222  NA 138 140 138 140  --   K 3.6 4.1 3.9 3.9  --   CL 102 101 103 103  --   CO2 --   GLUCOSE 80 211* 202* 240* 505*  BUN --   CREATININE 1.25* 1.58* 1.30* 1.11*  --   CALCIUM 9.5 8.9 9.1 9.1  --    Liver Function Tests:  Recent Labs Lab 02/21/15 1654 02/24/15 1500  AST 29 24  ALT 19 17  ALKPHOS 90 85  BILITOT 0.5 <0.1*  PROT 7.6 6.6  ALBUMIN 3.9 3.3*    Recent Labs Lab 02/24/15 1500  LIPASE 25   No results for input(s): AMMONIA in the last 168 hours. CBC:  Recent Labs Lab 02/21/15 1654 02/22/15 1947 02/24/15 1500 02/25/15 0533  WBC 8.4 8.9 12.0* 8.5  NEUTROABS  --   --  6.8  --   HGB 10.0* 9.9* 9.3* 8.6*  HCT 30.5* 30.3* 27.5* 26.0*  MCV 93.8 93.2 92.6 92.9  PLT 361 330 299 295   Cardiac Enzymes:  Recent Labs Lab 02/24/15 1500  TROPONINI <0.03   BNP (last 3 results) No results for input(s): BNP in the last 8760 hours.  ProBNP (last 3 results) No results for input(s): PROBNP in the last 8760 hours.  CBG:  Recent Labs Lab 02/25/15 2137 02/25/15 2147 02/26/15 0139 02/26/15 0748 02/26/15 1151  GLUCAP 504* 464* 264* 225* 316*    Recent Results (from the past 240 hour(s))  Culture, blood (routine x 2)     Status: None (Preliminary result)   Collection Time: 02/24/15  9:35 PM  Result Value Ref Range Status   Specimen Description BLOOD RIGHT ARM  Final   Special Requests BOTTLES DRAWN AEROBIC ONLY 10CC  Final   Culture NO  GROWTH 2 DAYS  Final   Report Status PENDING  Incomplete  Culture, blood (routine x 2)     Status: None (Preliminary result)   Collection Time: 02/24/15  9:43 PM  Result Value Ref Range Status   Specimen Description BLOOD RIGHT HAND  Final   Special Requests BOTTLES DRAWN AEROBIC ONLY 8CC  Final   Culture NO GROWTH 2 DAYS  Final   Report Status PENDING  Incomplete     Studies: Ct Head Wo Contrast  02/24/2015   CLINICAL DATA:  Confusion, hyperglycemia, altered mental status.  EXAM: CT HEAD WITHOUT CONTRAST  TECHNIQUE: Contiguous axial images were obtained from the base of the skull through the vertex without intravenous contrast.  COMPARISON:  None.  FINDINGS: There is mild volume loss throughout the cerebral hemispheres, somewhat age advanced. No acute intracranial abnormality. Specifically, no hemorrhage, hydrocephalus, mass lesion, acute infarction, or significant intracranial injury. No acute calvarial abnormality. Visualized paranasal sinuses and  mastoids clear. Orbital soft tissues unremarkable.  IMPRESSION: No acute intracranial abnormality.  Mild age advanced volume loss/ atrophy.   Electronically Signed   By: Charlett Nose M.D.   On: 02/24/2015 14:54   Portable Chest 1 View  02/25/2015   CLINICAL DATA:  Altered mental status.  EXAM: PORTABLE CHEST 1 VIEW  COMPARISON:  02/11/2015.  FINDINGS: Normal sized heart. Interval mildly elevated left hemidiaphragm. Clear lungs. Normal appearing bones.  IMPRESSION: Interval mild elevation of the left hemidiaphragm. Otherwise, unremarkable examination.   Electronically Signed   By: Beckie Salts M.D.   On: 02/25/2015 02:38    Scheduled Meds: . aspirin EC  81 mg Oral Daily  . carvedilol  6.25 mg Oral Daily  . divalproex  250 mg Oral Q12H  . gabapentin  100 mg Oral TID  . heparin  5,000 Units Subcutaneous 3 times per day  . insulin aspart  0-5 Units Subcutaneous QHS  . insulin aspart  0-9 Units Subcutaneous TID WC  . insulin NPH Human  12 Units  Subcutaneous BID AC & HS  . QUEtiapine  50 mg Oral QHS  . sodium chloride  3 mL Intravenous Q12H   Continuous Infusions:      Time spent:25 minutes    DHUNGEL, NISHANT  [facility] Pager [phone]. If 7PM-7AM, please contact night-coverage at [email or web address], password [facility] 02/26/2015, 1:41 PM  LOS: 2 days

## 2015-02-26 NOTE — Consult Note (Signed)
Reason for Consult: Confusion Referring Physician: Dr. Gonzella Lex  CC: "Confused due to kidney failure"  HPI: LISSETT FAVORITE is an 54 y.o. female with a history of hypertension, COPD, substance abuse, brittle diabetes, previous diabetic coma, previous MI, bipolar disorder, acute renal failure history, and seizure history, admitted to [facility] on 10/24/2014 with a 2 day history of altered mental status. A head CT without contrast on the day of admission showed no acute findings. A urine drug screen was positive for opiates and benzodiazepine's. On the day of admission her glucose was greater than 500. She has had severe episodes of hypoglycemia as well.  She was on Depakote and seroquel prior to admission. Currently her glucose is low and she is being given coffee with extra sugar. She denies confusion at this time.  Parents are in the room and feel that she is at baseline.     Past Medical History  Diagnosis Date  . Blood transfusion without reported diagnosis   . ETOH abuse   . Type I diabetes mellitus   . Anemia   . Diabetic coma 01/2015    prolonged hospitalization Hattie Perch 02/24/2015  . Altered mental status     for the past 2 days/notes 02/24/2015  . Heart attack 02/19/15  . Bipolar 1 disorder     "dx'd in [place] in 01/2015"   . Acute renal failure (ARF) 01/2015    renal failure requiring dialysis, rhabdomyolysis/notes 02/24/2015  . DKA (diabetic ketoacidoses) 01/2015  . Seizures     "related to her low blood sugars"/[name], sister-in-law 02/24/2015    Past Surgical History  Procedure Laterality Date  . Tonsillectomy    . Abdominal hysterectomy    . Cesarean section      Family history: Diabetes and heart disease  Social History:  reports that she has quit smoking. Her smoking use included Cigarettes. She has a 20 pack-year smoking history. She has never used smokeless tobacco. She reports that she drinks alcohol. She reports that she does not use illicit drugs.  No  Known Allergies  Medications:  Scheduled: . aspirin EC  81 mg Oral Daily  . carvedilol  6.25 mg Oral Daily  . heparin  5,000 Units Subcutaneous 3 times per day  . insulin aspart  0-5 Units Subcutaneous QHS  . insulin aspart  0-9 Units Subcutaneous TID WC  . insulin NPH Human  12 Units Subcutaneous BID AC & HS  . sodium chloride  3 mL Intravenous Q12H    ROS: History obtained from the patient  General ROS: negative for - chills, fatigue, fever, night sweats, weight gain or weight loss Psychological ROS: negative for - behavioral disorder, hallucinations, memory difficulties, mood swings or suicidal ideation Ophthalmic ROS: negative for - blurry vision, double vision, eye pain or loss of vision ENT ROS: negative for - epistaxis, nasal discharge, oral lesions, sore throat, tinnitus or vertigo Allergy and Immunology ROS: negative for - hives or itchy/watery eyes Hematological and Lymphatic ROS: negative for - bleeding problems, bruising or swollen lymph nodes Endocrine ROS: negative for - galactorrhea, hair pattern changes, polydipsia/polyuria or temperature intolerance Respiratory ROS: negative for - cough, hemoptysis, shortness of breath or wheezing Cardiovascular ROS: negative for - chest pain, dyspnea on exertion, edema or irregular heartbeat Gastrointestinal ROS: negative for - abdominal pain, diarrhea, hematemesis, nausea/vomiting or stool incontinence Genito-Urinary ROS: negative for - dysuria, hematuria, incontinence or urinary frequency/urgency Musculoskeletal ROS: negative for - joint swelling or muscular weakness. Bilateral ankle weakness 3 weeks  with pain in her feet. Neurological ROS: as noted in HPI Dermatological ROS: negative for rash and skin lesion changes   Physical Examination: - Nursing tech present throughout the entire exam. Blood pressure 110/86, pulse 71, temperature 98.6 F (37 C), temperature source Oral, resp. rate 20, height 5\' 7"  (1.702 m), weight 68.04 kg  (150 lb), SpO2 99 %.  General - pleasant 54 year old female sitting in bed in no acute distress Heart - Regular rate and rhythm - no murmer Lungs - Clear to auscultation Abdomen - Soft - non tender Extremities - Distal pulses intact - no edema Skin - Warm and dry   Neurologic Examination Mental Status: Alert, oriented to date and [facility]. She had difficulty with simple math calculations. She could not spell world backwards. The patient thought she was in [facility]. Otherwise her  thought content was appropriate.  Speech fluent without evidence of aphasia.  Able to follow 3 step commands without difficulty. Cranial Nerves: II: Discs not; Visual fields grossly normal, pupils equal, round, reactive to light and accommodation III,IV, VI: ptosis not present, extra-ocular motions intact bilaterally V,VII: smile symmetric, facial light touch sensation normal bilaterally VIII: hearing normal bilaterally IX,X: gag reflex present XI: bilateral shoulder shrug XII: midline tongue extension Motor: Right : Upper extremity   5/5    Left:     Upper extremity   5/5  Lower extremity   5/5     Lower extremity   5/5 Tone and bulk:normal tone throughout; no atrophy noted. Bilateral foot drop. Left weaker than right. Sensory: Light touch intact throughout, bilaterally Deep Tendon Reflexes: 2+ and symmetric throughout Plantars: Right: downgoing   Left: downgoing Cerebellar: normal finger-to-nose, normal rapid alternating movements and normal heel-to-shin test Gait: Deferred CV: pulses palpable throughout    Laboratory Studies:   Basic Metabolic Panel:  Recent Labs Lab 02/21/15 1654 02/22/15 1947 02/24/15 1500 02/25/15 0533 02/25/15 2222  NA 138 140 138 140  --   K 3.6 4.1 3.9 3.9  --   CL 102 101 103 103  --   CO2 24 29 24 24   --   GLUCOSE 80 211* 202* 240* 505*  BUN 16 15 15 10   --   CREATININE 1.25* 1.58* 1.30* 1.11*  --   CALCIUM 9.5 8.9 9.1 9.1  --     Liver Function  Tests:  Recent Labs Lab 02/21/15 1654 02/24/15 1500  AST 29 24  ALT 19 17  ALKPHOS 90 85  BILITOT 0.5 <0.1*  PROT 7.6 6.6  ALBUMIN 3.9 3.3*    Recent Labs Lab 02/24/15 1500  LIPASE 25   No results for input(s): AMMONIA in the last 168 hours.  CBC:  Recent Labs Lab 02/21/15 1654 02/22/15 1947 02/24/15 1500 02/25/15 0533  WBC 8.4 8.9 12.0* 8.5  NEUTROABS  --   --  6.8  --   HGB 10.0* 9.9* 9.3* 8.6*  HCT 30.5* 30.3* 27.5* 26.0*  MCV 93.8 93.2 92.6 92.9  PLT 361 330 299 295    Cardiac Enzymes:  Recent Labs Lab 02/24/15 1500  TROPONINI <0.03    BNP: Invalid input(s): POCBNP  CBG:  Recent Labs Lab 02/26/15 0748 02/26/15 1151 02/26/15 1628 02/26/15 1630 02/26/15 1717  GLUCAP 225* 316* 59* 58* 40*    Microbiology: Results for orders placed or performed during the hospital encounter of 02/24/15  Culture, blood (routine x 2)     Status: None (Preliminary result)   Collection Time: 02/24/15  9:35 PM  Result Value Ref Range Status   Specimen Description BLOOD RIGHT ARM  Final   Special Requests BOTTLES DRAWN AEROBIC ONLY 10CC  Final   Culture NO GROWTH 2 DAYS  Final   Report Status PENDING  Incomplete  Culture, blood (routine x 2)     Status: None (Preliminary result)   Collection Time: 02/24/15  9:43 PM  Result Value Ref Range Status   Specimen Description BLOOD RIGHT HAND  Final   Special Requests BOTTLES DRAWN AEROBIC ONLY 8CC  Final   Culture NO GROWTH 2 DAYS  Final   Report Status PENDING  Incomplete    Coagulation Studies: No results for input(s): LABPROT, INR in the last 72 hours.  Urinalysis:   Recent Labs Lab 02/22/15 2240 02/25/15 0041  COLORURINE YELLOW YELLOW  LABSPEC 1.009 1.021  PHURINE 6.5 5.5  GLUCOSEU 250* >1000*  HGBUR NEGATIVE NEGATIVE  BILIRUBINUR NEGATIVE NEGATIVE  KETONESUR NEGATIVE 40*  PROTEINUR NEGATIVE NEGATIVE  UROBILINOGEN 0.2 0.2  NITRITE NEGATIVE NEGATIVE  LEUKOCYTESUR NEGATIVE NEGATIVE    Lipid  Panel:  No results found for: CHOL, TRIG, HDL, CHOLHDL, VLDL, LDLCALC  HgbA1C:  Lab Results  Component Value Date   HGBA1C 7.8* 02/14/2015    Urine Drug Screen:      Component Value Date/Time   LABOPIA POSITIVE* 02/25/2015 0041   COCAINSCRNUR NONE DETECTED 02/25/2015 0041   LABBENZ POSITIVE* 02/25/2015 0041   AMPHETMU NONE DETECTED 02/25/2015 0041   THCU NONE DETECTED 02/25/2015 0041   LABBARB NONE DETECTED 02/25/2015 0041    Alcohol Level:   Recent Labs Lab 02/21/15 1717 02/24/15 2135  ETH <5 <5    Other results: EKG: Sinus rhythm rate 80 bpm. Please refer to the formal cardiology reading for complete details.  Imaging:  Portable Chest 1 View 02/25/2015    Interval mild elevation of the left hemidiaphragm. Otherwise, unremarkable examination.    Head CT without contrast 02/24/2015 No acute intracranial abnormality. Mild age advanced volume loss/ atrophy.   Delton See PA-C [facility] Pager [phone] 02/26/2015, 5:53 PM  Patient seen and examined.  Clinical course and management discussed.  Necessary edits performed.  I agree with the above.  Assessment and plan of care developed and discussed below.     Assessment/Plan:  54 year old female with multiple medical problems as listed above admitted [facility] on 02/24/2015 with a 2 day history of altered mental status. I suspect some of this may be psychiatric in nature compounded by metabolic disturbances. A psychiatric consult has been obtained. With the patient having experienced multiple episodes of hypoglycemia will rule out cerebral injury related to such.  Head CT reviewed and shows no acute changes.    Recommendations: 1.  MRI of the brain without contrast   Thana Farr, MD [facility] [phone]  02/26/2015  6:05 PM

## 2015-02-27 ENCOUNTER — Inpatient Hospital Stay (HOSPITAL_COMMUNITY): Payer: BLUE CROSS/BLUE SHIELD

## 2015-02-27 DIAGNOSIS — R4182 Altered mental status, unspecified: Secondary | ICD-10-CM

## 2015-02-27 DIAGNOSIS — G990 Autonomic neuropathy in diseases classified elsewhere: Secondary | ICD-10-CM

## 2015-02-27 DIAGNOSIS — E1042 Type 1 diabetes mellitus with diabetic polyneuropathy: Secondary | ICD-10-CM

## 2015-02-27 LAB — CBC
HEMATOCRIT: 26.9 % — AB (ref 36.0–46.0)
Hemoglobin: 8.9 g/dL — ABNORMAL LOW (ref 12.0–15.0)
MCH: 30.3 pg (ref 26.0–34.0)
MCHC: 33.1 g/dL (ref 30.0–36.0)
MCV: 91.5 fL (ref 78.0–100.0)
PLATELETS: 262 10*3/uL (ref 150–400)
RBC: 2.94 MIL/uL — ABNORMAL LOW (ref 3.87–5.11)
RDW: 14.2 % (ref 11.5–15.5)
WBC: 6.8 10*3/uL (ref 4.0–10.5)

## 2015-02-27 LAB — BASIC METABOLIC PANEL
ANION GAP: 7 (ref 5–15)
BUN: 7 mg/dL (ref 6–20)
CALCIUM: 9.1 mg/dL (ref 8.9–10.3)
CO2: 29 mmol/L (ref 22–32)
CREATININE: 1.11 mg/dL — AB (ref 0.44–1.00)
Chloride: 107 mmol/L (ref 101–111)
GFR, EST NON AFRICAN AMERICAN: 56 mL/min — AB (ref >60)
GLUCOSE: 91 mg/dL (ref 65–99)
Potassium: 3.4 mmol/L — ABNORMAL LOW (ref 3.5–5.1)
Sodium: 143 mmol/L (ref 135–145)

## 2015-02-27 LAB — GLUCOSE, CAPILLARY
Glucose-Capillary: 304 mg/dL — ABNORMAL HIGH (ref 65–99)
Glucose-Capillary: 193 mg/dL — ABNORMAL HIGH (ref 65–99)
Glucose-Capillary: 239 mg/dL — ABNORMAL HIGH (ref 65–99)
Glucose-Capillary: 153 mg/dL — ABNORMAL HIGH (ref 65–99)

## 2015-02-27 LAB — AMMONIA: Ammonia: 22 umol/L (ref 9–35)

## 2015-02-27 MED ORDER — GABAPENTIN 100 MG PO CAPS
100.0000 mg | ORAL_CAPSULE | Freq: Three times a day (TID) | ORAL | Status: DC
  Administered 2015-02-27 – 2015-03-02 (×11): 100 mg via ORAL
  Filled 2015-02-27 (×11): qty 1

## 2015-02-27 MED ORDER — INSULIN ASPART 100 UNIT/ML ~~LOC~~ SOLN
0.0000 [IU] | Freq: Three times a day (TID) | SUBCUTANEOUS | Status: DC
  Administered 2015-02-27: 5 [IU] via SUBCUTANEOUS
  Administered 2015-02-27: 3 [IU] via SUBCUTANEOUS
  Administered 2015-02-28: 15 [IU] via SUBCUTANEOUS
  Administered 2015-02-28 (×2): 5 [IU] via SUBCUTANEOUS
  Administered 2015-03-01: 11 [IU] via SUBCUTANEOUS
  Administered 2015-03-02: 3 [IU] via SUBCUTANEOUS
  Administered 2015-03-02: 11 [IU] via SUBCUTANEOUS
  Administered 2015-03-02: 3 [IU] via SUBCUTANEOUS

## 2015-02-27 MED ORDER — INSULIN GLARGINE 100 UNIT/ML ~~LOC~~ SOLN: 20.0000 [IU] | Freq: Every day | SUBCUTANEOUS | Status: DC

## 2015-02-27 MED ORDER — INSULIN GLARGINE 100 UNIT/ML ~~LOC~~ SOLN
20.0000 [IU] | Freq: Every day | SUBCUTANEOUS | Status: DC
  Administered 2015-02-28: 20 [IU] via SUBCUTANEOUS
  Filled 2015-02-27 (×3): qty 0.2

## 2015-02-27 MED ORDER — INSULIN GLARGINE 100 UNIT/ML ~~LOC~~ SOLN
25.0000 [IU] | Freq: Every day | SUBCUTANEOUS | Status: DC
Start: 2015-02-27 — End: 2015-02-27

## 2015-02-27 MED ORDER — POTASSIUM CHLORIDE CRYS ER 20 MEQ PO TBCR
40.0000 meq | EXTENDED_RELEASE_TABLET | Freq: Once | ORAL | Status: AC
  Administered 2015-02-27: 40 meq via ORAL
  Filled 2015-02-27: qty 2

## 2015-02-27 MED ORDER — INSULIN ASPART 100 UNIT/ML ~~LOC~~ SOLN
3.0000 [IU] | Freq: Three times a day (TID) | SUBCUTANEOUS | Status: DC
  Administered 2015-02-27 – 2015-03-02 (×9): 3 [IU] via SUBCUTANEOUS

## 2015-02-27 NOTE — Progress Notes (Signed)
Subjective: patient has no complaints. She is aware she is in the hospital and able to tell me the month and year.   Exam: Filed Vitals:   02/27/15 0536  BP: 143/50  Pulse: 73  Temp: 98.4 F (36.9 C)  Resp: 16    HEENT-  Normocephalic, no lesions, without obvious abnormality.  Normal external eye and conjunctiva.  Normal TM's bilaterally.  Normal auditory canals and external ears. Normal external nose, mucus membranes and septum.  Normal pharynx. Cardiovascular- S1, S2 normal, pulses palpable throughout   Lungs- chest clear, no wheezing, rales, normal symmetric air entry Abdomen- normal findings: bowel sounds normal Extremities- no edema Lymph-no adenopathy palpable Musculoskeletal-no joint tenderness, deformity or swelling Skin-warm and dry, no hyperpigmentation, vitiligo, or suspicious lesions    Gen: In bed, NAD MS: AOX3, follows all commands. Speech clear with no aphasia CN: PERRLA, EOMI, TML, visual fields intact, sensation intact Motor: 5/5 throughout UE and LE with exception of bilateral ankles. She can wiggle toes on right foot and evert/plantar flex right ankle but not invert.  Left ankle shows no ability to evert or invert, PF/DF and not wiggling toes.  Of note: her gait shows no problem when she stood up and walked away from me unassisted.  Sensory: intact throughout DTR: 2+ throughout  Pertinent Labs: K 3.4 Cr 1.11 VPA level 23   MRI brain-- IMPRESSION: No acute infarct.  No intracranial hemorrhage.  Nonspecific white matter changes including symmetric altered signal intensity of the middle cerebral peduncle region. This may reflect result of small vessel disease or metabolic abnormality. No other findings to suggest intracranial infection.  Global atrophy without hydrocephalus.  Felicie Morn PA-C [facility] [phone]  Impression:  54 yo F with AMS in the setting of multiple recent severe episodes of hypoglycemia, now much improved.  Thoguh possible she has had some injury from her episodes, her improvement already is reassuring. At this point, I think that glucose management would be the main modality to avoid further episodes.   Recommendations: 1) Glucose control per rpimary team.  2) Neurology to sign off, please call with any further questions or concerns.    Ritta Slot, MD [facility] [phone]  If 7pm- 7am, please page neurology on call as listed in AMION.  02/27/2015, 12:50 PM

## 2015-02-27 NOTE — Procedures (Signed)
ELECTROENCEPHALOGRAM REPORT  Patient: Kimberly Pena       Room #: 4H67 EEG No. ID: [id number] Age: 54 y.o.        Sex: female Referring Physician: Orlena Sheldon Report Date:  02/27/2015        Interpreting Physician: Aline Brochure  History: [name] is an 54 y.o. female with a history of hypertension, COPD, substance abuse and use mellitus who was admitted with altered mental status for 2 days and hyperglycemia.  Indications for study:  Rule out encephalopathy.  Technique: This is an 18 channel routine scalp EEG performed at the bedside with bipolar and monopolar montages arranged in accordance to the international 10/20 system of electrode placement.   Description: This EEG recording was performed during wakefulness and during drowsiness. Predominant background activity during wakefulness consisted of 9 Hz symmetrical alpha rhythm which attenuated well with eye-opening. During drowsiness there was slowing of background activity with predominantly rhythmic 4-5 Hz diffuse theta activity which was most prominent in the frontal and central regions. Photic stimulation produced a minimal occipital driving response bilaterally. Hyperventilation was not performed. No epileptiform discharges were recorded.  Interpretation: This is a normal EEG recording during wakefulness and during drowsiness. No evidence of an encephalopathic process, nor seizure activity was demonstrated.   Venetia Maxon M.D. [facility] [phone]

## 2015-02-27 NOTE — Progress Notes (Signed)
EEG Completed; Results Pending  
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
## 2015-02-27 NOTE — Progress Notes (Signed)
Inpatient Diabetes Program Recommendations  AACE/ADA: New Consensus Statement on Inpatient Glycemic Control (2015)  Target Ranges:  Prepandial:   less than 140 mg/dL      Peak postprandial:   less than 180 mg/dL (1-2 hours)      Critically ill patients:  140 - 180 mg/dL   Review of Glycemic Control Agree with basal and bolus insulin orders: Patient weight of 68 kg, using a 0.6 unit/kg for total daily dose of insulin, 1/2 as basal and 1/2 as bolus meal coverage/correction: 20 units lantus and 3-6 units meal coverage (presently ordered 3 units and moderate correction tidwc and HS correction. Pt now to go to [facility] and should be able to get her insulin for home on discharge.  Thank you Lenor Coffin, RN, MSN, CDE  Diabetes Inpatient Program Office: [phone] Pager: [phone] 8:00 am to 5:00 pm

## 2015-02-27 NOTE — Care Management Note (Signed)
Case Management Note  Patient Details  Name: KWYNN MCCULLICK MRN: [id number] Date of Birth: 03/03/1961  Subjective/Objective:     Date: 02/27/15 Spoke with patient at the bedside. Introduced self as Sports coach and explained role in discharge planning and how to be reached. Verified patient lives in town,  with brother and sister n Social worker. Expressed potential need for no other DME. Verified patient anticipates to go home with family at time of discharge and will have  part-time supervision by family  at this time to best of their knowledge. Patient  denied needing help with their medication. Patient drives to MD appointments. Patient states she use to see Tawanna Cooler Mcdiarmid but has not seen him lately.  NCM contacted  [facility], she will have follow up at the sickle cell clinic, they are taking overflow for the clinic on 9/29 at 11 am.   Plan: CM will continue to follow for discharge planning and [facility] resources.                Action/Plan:   Expected Discharge Date:                  Expected Discharge Plan:  Home/Self Care  In-House Referral:     Discharge planning Services  CM Consult  Post Acute Care Choice:    Choice offered to:     DME Arranged:    DME Agency:     HH Arranged:    HH Agency:     Status of Service:  In process, will continue to follow  Medicare Important Message Given:    Date Medicare IM Given:    Medicare IM give by:    Date Additional Medicare IM Given:    Additional Medicare Important Message give by:     If discussed at Long Length of Stay Meetings, dates discussed:    Additional Comments:  Leone Haven, RN 02/27/2015, 12:20 PM

## 2015-02-28 ENCOUNTER — Ambulatory Visit (HOSPITAL_COMMUNITY): Payer: Self-pay | Admitting: Psychiatry

## 2015-02-28 DIAGNOSIS — E876 Hypokalemia: Secondary | ICD-10-CM

## 2015-02-28 LAB — GLUCOSE, CAPILLARY
GLUCOSE-CAPILLARY: 206 mg/dL — AB (ref 65–99)
GLUCOSE-CAPILLARY: 239 mg/dL — AB (ref 65–99)
Glucose-Capillary: 456 mg/dL — ABNORMAL HIGH (ref 65–99)
Glucose-Capillary: 365 mg/dL — ABNORMAL HIGH (ref 65–99)

## 2015-02-28 LAB — HEAVY METALS, BLOOD
ARSENIC: 6 ug/L (ref 2–23)
LEAD: 2 ug/dL (ref 0–19)
MERCURY: NOT DETECTED ug/L (ref 0.0–14.9)

## 2015-02-28 NOTE — Progress Notes (Addendum)
NCM spoke with sister n law, Kimberly Pena and she states she can not take patient back to her home, she does not feel comfortable because patient has been back and forth to the hospital so much.  She states someone from [facility] onsite will be coming to check on status of patient, NCM asked who will that be , but Kimberly Pena could not tell me.  MD informed of this information.   NCM spoke with patient , with CN , patient states she pays her own bills and she hopes to go back home with her sister n law,  Patient will call sister n law to see what she says.  NCM checked back with patient and she states her sister n law , Kimberly Pena says she can come back and stay with her.  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  NCM called Kimberly Pena but did not get an answer.   Kimberly Pena called NCM back and stated that she did not tell patent she can come live with her.  Kimberly Pena states patient needs 24 hr care, she has two kids of her own and she can not take on this responsibility.  Patient 's father and step mom live in [place].  Per Kimberly Pena they do not want her to come there , that's why she took her in the first time trying to help her.  Kimberly Pena states she did not want to tell patient she could not come to stay with her because she did not want to upset her.  Per physician advisor we need to get a psych consult.
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
## 2015-02-28 NOTE — Clinical Social Work Note (Addendum)
Clinical Social Work Assessment  Patient Details  Name: Kimberly Pena MRN: [id number] Date of Birth: 06/10/1960  Date of referral:  02/28/15               Reason for consult:  Facility Placement                Permission sought to share information with:  Family Supports, Chartered certified accountant granted to share information::  Yes, Verbal Permission Granted  Name::     [facility]  Agency::  SNFs  Relationship::     Contact Information:     Housing/Transportation Living arrangements for the past 2 months:  [name] of Information:  Patient, Other (Comment Required) (Kimberly Pena) Patient Interpreter Needed:  None Criminal Activity/Legal Involvement Pertinent to Current Situation/Hospitalization:  No - Comment as needed Significant Relationships:  Other Family Members, Siblings Lives with:  Siblings Do you feel safe going back to the place where you live?  Yes Need for family participation in patient care:  Yes (Comment)  Care giving concerns:  Patient's family was requesting SNF placement for patient due to her altered mental status, specifically because the patient cannot manage her diabetes by herself at this time.   Social Worker assessment / plan:  CSW met with the patient and patient's sister in law Kimberly Pena to complete assessment. Kimberly requests placement because she doesn't think the patient should go home with her current mentation. The patient is doing very well from a mobility standpoint. CSW explained that the patient's insurance Nurse, mental health) will not approve a SNF stay for altered mental status alone. Kimberly Pena was initially agreeable to the patient returning home with [facility] services and nurse visits at home. CSW received followup voicemail from Kimberly Pena stating that the patient "cannot" come to her home at discharge. CSW has made RNCM aware of situation as the patient does not have a payor source for facility placement. [name] main concern is managing the  patient's diabetes at home. CSW has explained situation to the patient and the patient states she feels comfortable returning home, but per documentation the patient's mental status is not at baseline at this time.   UPDATE: The option of private pay placement was discussed with Kimberly Pena and the patient. Per Kimberly Pena the patient has approximately $7,000 to $8,000 in savings that could be utilized for placement at ALF if the patient would agree.   Employment status:  Unemployed Forensic scientist:  Managed Care PT Recommendations:  Home with [place] / Referral to community resources:  Other (Comment Required) (CSW has referred patient/family to case management. )  Patient/Family's Response to care: Patient's Kimberly in Kimberly Pena is very involved in the patient's care.  Patient/Family's Understanding of and Emotional Response to Diagnosis, Current Treatment, and Prognosis:  Patient's Kimberly appears to have good understanding of reason for patient's admission. Kimberly [name] doesn't seem to want the patient returning to her home at discharge and doesn't want the responsibility of the patient.   Emotional Assessment Appearance:  Appears stated age Attitude/Demeanor/Rapport:  Other (Appropriate) Affect (typically observed):  Accepting, Calm, Flat Orientation:  Oriented to Self, Oriented to Place Alcohol / Substance use:  Alcohol Use (Hx of) Psych involvement (Current and /or in the community):  No (Comment)  Discharge Needs  Concerns to be addressed:  Discharge Planning Concerns Readmission within the last 30 days:  Yes Current discharge risk:  Cognitively Impaired Barriers to Discharge:  Continued Medical Work up   Kimberly Pena,  Kimberly Pena, Kimberly Pena

## 2015-02-28 NOTE — Progress Notes (Signed)
Patient having CBG 456 at 8AM. MD informed; no new orders at this time (give her coverage). Will continue to monitor,
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
## 2015-03-01 LAB — GLUCOSE, CAPILLARY
Glucose-Capillary: 330 mg/dL — ABNORMAL HIGH (ref 65–99)
Glucose-Capillary: 100 mg/dL — ABNORMAL HIGH (ref 65–99)
Glucose-Capillary: 82 mg/dL (ref 65–99)
Glucose-Capillary: 223 mg/dL — ABNORMAL HIGH (ref 65–99)

## 2015-03-01 LAB — CULTURE, BLOOD (ROUTINE X 2)
CULTURE: NO GROWTH
Culture: NO GROWTH

## 2015-03-01 LAB — VITAMIN B1: VITAMIN B1 (THIAMINE): 119.7 nmol/L (ref 66.5–200.0)

## 2015-03-01 MED ORDER — INSULIN GLARGINE 100 UNIT/ML ~~LOC~~ SOLN
24.0000 [IU] | Freq: Every day | SUBCUTANEOUS | Status: DC
  Administered 2015-03-01 – 2015-03-02 (×2): 24 [IU] via SUBCUTANEOUS
  Filled 2015-03-01 (×2): qty 0.24

## 2015-03-01 NOTE — Progress Notes (Addendum)
3:30pm CSW and RNCM met with pt concerning plan at time of DC.  CSW informed pt that [name] is not wanting her to return to the house when she leaves the hospital.  Pt states that she has friends and family she can call for a place to live.  CSW discussed idea of ALF if needed- pt is not agreeable to ALF due to costs ($1800/month).  During interview pt seem alert and oriented though did not always seem to comprehend the question fully (when asked where did you live prior to the hospital the pt repetitively said "the hospital"- states she has been in the hospital for 5 weeks now over last admission and this one)  CSW informed pt of likely DC tomorrow and encouraged her to begin calling contacts for a place to live.  11am CSW working with supervisor on placement option if pt is found not to have capacity- CSW sent referral to [facility]- they are reviewing.  Still awaiting psych eval for capacity  CSW will continue to follow.  Domenica Reamer, [place] Social Worker [phone]

## 2015-03-01 NOTE — Consult Note (Signed)
Kimberly M. Wilson EdD 
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
## 2015-03-01 NOTE — Progress Notes (Signed)
Inpatient Diabetes Program Recommendations  AACE/ADA: New Consensus Statement on Inpatient Glycemic Control (2015)  Target Ranges:  Prepandial:   less than 140 mg/dL      Peak postprandial:   less than 180 mg/dL (1-2 hours)      Critically ill patients:  140 - 180 mg/dL   Review of Glycemic Control  Diabetes history: DM 2 Current orders for Inpatient glycemic control: lantus 24 units Daily, Novolog moderate + Novolog 3 units MC + HS  Inpatient Diabetes Program Recommendations: Insulin - Basal: Glucose in the 300s this am after 20 units of basal insulin. Please consider increasing basal insulin to Lantus 34 units Q 24hrs. Please order additional dose for today of Lantus 10 units.   Note: unsure glucose levels are so high considering A1c is 7.8%, and home dose of basal insulin is so low.  Thanks,  Christena Deem RN, MSN, [facility] Inpatient Diabetes Coordinator Team Pager [phone] (8a-5p)

## 2015-03-02 ENCOUNTER — Ambulatory Visit: Payer: BLUE CROSS/BLUE SHIELD | Admitting: Family Medicine

## 2015-03-02 LAB — GLUCOSE, CAPILLARY
Glucose-Capillary: 98 mg/dL (ref 65–99)
Glucose-Capillary: 156 mg/dL — ABNORMAL HIGH (ref 65–99)
Glucose-Capillary: 187 mg/dL — ABNORMAL HIGH (ref 65–99)
Glucose-Capillary: 331 mg/dL — ABNORMAL HIGH (ref 65–99)

## 2015-03-02 MED ORDER — INSULIN GLARGINE 100 UNIT/ML ~~LOC~~ SOLN: 24.0000 [IU] | Freq: Every day | SUBCUTANEOUS | Status: AC

## 2015-03-02 MED ORDER — INSULIN ASPART 100 UNIT/ML ~~LOC~~ SOLN: 0.0000 [IU] | Freq: Three times a day (TID) | SUBCUTANEOUS | Status: AC

## 2015-03-02 MED ORDER — ACETAMINOPHEN 325 MG PO TABS: 650.0000 mg | ORAL_TABLET | Freq: Four times a day (QID) | ORAL | Status: AC | PRN

## 2015-03-02 NOTE — Progress Notes (Signed)
NCM asked MD to call patient's family member, Cala Bradford regarding plan of care and transfer request.

## 2015-03-02 NOTE — Clinical Social Work Note (Signed)
CSW was notified by MD that the patient is ready for DC today. Psychiatry has deemed the patient to have capacity to make her own decisions. CSW met with patient to offer ALF placement for patient again as the treatment team thinks this is in the patient's best interest. The patient adamantly refuses any type of facility placement at this time. Private pay placement was offered to the patient with [facility] in [place], [place]. The patient was informed of the monthly cost of $1,800.00 for ALF placement. The patient continued to decline this option. CSW informed RNCM and MD of the patient's decision. Patient continued to avoid disclosing where she planned to go at discharge throughout the day. RNCM met with patient to determine where home health should be sent for her services after discharge. Patient informed RNCM and CSW that she had "nowhere to go." Local homeless shelter resources were offered for the patient, but the patient refused these. She stated that she would only go to a shelter in [place], [place] where she is from. CSW Kimberly Pena approved a bus ticket for patient from [place], [place] to [employer]. CSW provided the patient with the following which were requested by the patient:   Patient was provided taxi voucher for transportation to [facility]. RN was informed to call for taxi at [facility].  Patient was provided with one way [employer] bus ticket from [place], [place] to [place], [place]. (Copies of tickets in chart)  Patient was provided with a bag of food which included a hot meal, two sandwiches, three bags of chips, 6 small bottles of water, and 2 pepsis (due to her diabetes)  Patient was provided with insulin to travel with to manage her blood sugars.  Patient was provided with exhaustive list of homeless shelters in the [place], [place] area, though the patient stated she would be going to her home in [place].  The patient verbalized  that she understood her itinerary and how to navigate the busses. The patient did state that she was not happy with the route as she wanted to be taken directly to [facility]. CSW explained to patient that unfortunately the route provided is the only option for the patient. CSW inquired about any other needs that the patient may have. The patient stated that she didn't have any other needs at this time. The patient's sister in law Kimberly Pena contacted [facility] today regarding the patient's discharge plan. CSW met with the patient at bedside to ask if she was ok with CSW discussing the patient's discharge plan with Kimberly Pena. The patient was agreeable to this. Kimberly Pena insisted that the patient be discharged to an assisted living facility but wanted CSW to find other facilities that would accept the patient. CSW explained that [facility] has offered the patient a bed and that the patient has already adamantly refused placement of any kind as she does not want to spend her money on ALF placement. CSW politely explained that this disposition option would not be pursued any further per the patient's wishes. CSW signing off at this time as the patient does not have any other CSW related needs.   Kimberly Pena MSW, [place], [place], [phone]

## 2015-03-02 NOTE — Progress Notes (Signed)
Patient very agitated refused heparin injection this am states she wants to be left alone

## 2015-03-02 NOTE — Progress Notes (Signed)
Patient has tolerated 100% of her breakfast and her meal tray with multiple cups of coffee in between meals.

## 2015-03-02 NOTE — Care Management Note (Addendum)
Case Management Note  Patient Details  Name: Kimberly Pena MRN: [id number] Date of Birth: August 25, 1960  Subjective/Objective:     Patient is for dc today, we have secured an ALF , she has turned this down, she has capacity,  Patient was offered [facility] services she refused these services , NCM gave her a private duty list, NCM has informed her that she is for dc today. CSW gave patient a bus pass to [facility] to get home and a taxi vocher. Patient also will have sandwiches and snacks. Patient seemed to be satisfied with this plan. Patient states she has a glucometer with her as well.   It is determined that patient is going back to [facility] to her home.  She will need a follow up apt made to an endocrinologist,  Patient had a follow up apt with Romero Belling an Endocrinologist  Here  in [place], this will need to be canceled since she has decided she is going to [facility].    NCM will call to try to get follow up tomorrow since doctor's office is closed today.  Patient's phone is [phone].  The appointments made for her here [facility] will need to be canceled tomorrow because it is determine late in the day patient will be going back to [facility].               Action/Plan:   Expected Discharge Date:                  Expected Discharge Plan:  Assisted Living / Rest Home  In-House Referral:  Clinical Social Work  Discharge planning Services  CM Consult  Post Acute Care Choice:    Choice offered to:  Patient  DME Arranged:    DME Agency:     HH Arranged:  Patient Refused HH Agency:     Status of Service:  Completed, signed off  Medicare Important Message Given:    Date Medicare IM Given:    Medicare IM give by:    Date Additional Medicare IM Given:    Additional Medicare Important Message give by:     If discussed at Long Length of Stay Meetings, dates discussed:    Additional Comments:  Leone Haven, RN 03/02/2015,  5:15 PM

## 2015-03-02 NOTE — Progress Notes (Addendum)
Patient is for dc today, we have secured a ALF , but patient has turned this down she has capacity. NCM has informed patient that she is for dc today.
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
## 2015-03-02 NOTE — Care Management Note (Signed)
Case Management Note  Patient Details  Name: Kimberly Pena MRN: [id number] Date of Birth: 25-Jan-1961  Subjective/Objective:      NCM spoke with patient about HHPT, patient states she just took a shower by herself and she does not need HHPT.  NCM offered to give her the private duty list, patient took that list.   Patient states when she finds out where  She is going she will let us know.            Action/Plan:   Expected Discharge Date:                  Expected Discharge Plan:  Home w Home Health Services  In-House Referral:     Discharge planning Services  CM Consult  Post Acute Care Choice:    Choice offered to:  Patient  DME Arranged:    DME Agency:     HH Arranged:  Patient Refused HH Agency:     Status of Service:  Completed, signed off  Medicare Important Message Given:    Date Medicare IM Given:    Medicare IM give by:    Date Additional Medicare IM Given:    Additional Medicare Important Message give by:     If discussed at Long Length of Stay Meetings, dates discussed:    Additional Comments:  Leone Haven, RN 03/02/2015, 2:07 PM

## 2015-03-02 NOTE — Progress Notes (Addendum)
Patient is for dc today, we have secured an ALF , she has turned this down, she has capacity, NCM has informed her that she is for dc today.  CSW gave patient a bus pass to [facility] to get home and a taxi vocher.  Patient also will have sandwiches and snacks.  Patient seemed to be satisfied with this plan.  Patient states she has a glucometer with her as well.

## 2015-03-02 NOTE — Discharge Instructions (Signed)
Follow with PCP on scheduled appointment with sickle cell clinic on 03/07/2015. please follow with endocrinology on scheduled appointment 03/08/2015  Get CBC, CMP, 2 view Chest X ray checked  by Primary MD next visit.    Activity: As tolerated with Full fall precautions use walker/cane & assistance as needed   Disposition Home .   Diet: Heart Healthy, carbohydrate modified, patient was encouraged to eat snacks at bedtime .   On your next visit with your primary care physician please Get Medicines reviewed and adjusted.   Please request your Prim.MD to go over all Hospital Tests and Procedure/Radiological results at the follow up, please get all Hospital records sent to your Prim MD by signing hospital release before you go home.   If you experience worsening of your admission symptoms, develop shortness of breath, life threatening emergency, suicidal or homicidal thoughts you must seek medical attention immediately by calling 911 or calling your MD immediately  if symptoms less severe.  You Must read complete instructions/literature along with all the possible adverse reactions/side effects for all the Medicines you take and that have been prescribed to you. Take any new Medicines after you have completely understood and accpet all the possible adverse reactions/side effects.   Do not drive, operating heavy machinery, perform activities at heights, swimming or participation in water activities or provide baby sitting services if your were admitted for syncope or siezures until you have seen by Primary MD or a Neurologist and advised to do so again. Patient was notified of that.  Do not drive when taking Pain medications.    Do not take more than prescribed Pain, Sleep and Anxiety Medications  Special Instructions: If you have smoked or chewed Tobacco  in the last 2 yrs please stop smoking, stop any regular Alcohol  and or any Recreational drug use.  Wear Seat belts while  driving.   Please note  You were cared for by a hospitalist during your hospital stay. If you have any questions about your discharge medications or the care you received while you were in the hospital after you are discharged, you can call the unit and asked to speak with the hospitalist on call if the hospitalist that took care of you is not available. Once you are discharged, your primary care physician will handle any further medical issues. Please note that NO REFILLS for any discharge medications will be authorized once you are discharged, as it is imperative that you return to your primary care physician (or establish a relationship with a primary care physician if you do not have one) for your aftercare needs so that they can reassess your need for medications and monitor your lab values.

## 2015-03-02 NOTE — Progress Notes (Signed)
Patient discharged to [facility] to her home. Patient escorted off unit via wheel chair by Delena Serve, CNA. Patient provided with voucher for taxi driver, and bus tickets per [place] SW. Discharge instructions, and follow ups reviewed with patient. Patient allowed to verbalize use of sliding scale, when to check blood sugars and how to administer insulins. Patient states "I have been a diabetic for 30 years, and I know how to self administer insulin and check blood sugar". Patient provided with insulins as prescribed, a full dinner, and multiple snacks for bus ride. Pt. States she will eat when she gets on the bus. Spoke with Gavin Pound, CM who is to call pt. Tomorrow with follow-up date and time of an Endocrinologist of [place], [place]. Patient Informed that Gavin Pound will contact her via her cell phone number. Pt. Instructed to return to nearest ED if symptoms re-occur, pt. Verbally understood.

## 2015-03-02 NOTE — Discharge Summary (Addendum)
Kimberly Pena, is a 54 y.o. female  DOB [birth year]  MRN [id number].  Admission date:  02/24/2015  Admitting Physician  No admitting Lamona Eimer for patient encounter.  Discharge Date:  03/02/2015   Primary MD  No primary care Yusef Lamp on file.  Recommendations for primary care physician for things to follow:  - Check basic labs including CBC, BMP during next visit - Patient has scheduled appointment with endocrinology on 03/08/2015 regarding further management of her brittle diabetes mellitus. - Patient is new to [facility] area with no PCP, so she has an appointment scheduled for primary M.D. follow-up on on sickle cell clinic on 03/07/2015. - Anemia workup as an outpatient  Admission Diagnosis  Diabetes mellitus type 1, uncontrolled [E10.65] Altered mental status, unspecified altered mental status type [R41.82]   Discharge Diagnosis  Diabetes mellitus type 1, uncontrolled [E10.65] Altered mental status, unspecified altered mental status type [R41.82]    Principal Problem:   Episodic mood disorder Active Problems:   Altered mental status   Altered mental state   Diabetes mellitus type 1, uncontrolled   Brittle diabetes mellitus   Uncontrolled type 1 diabetes mellitus   Acute encephalopathy      Past Medical History  Diagnosis Date  . Blood transfusion without reported diagnosis   . ETOH abuse   . Type I diabetes mellitus   . Anemia   . Diabetic coma 01/2015    prolonged hospitalization Hattie Perch 02/24/2015  . Altered mental status     for the past 2 days/notes 02/24/2015  . Heart attack 02/19/15  . Bipolar 1 disorder     "dx'd in [place] in 01/2015"   . Acute renal failure (ARF) 01/2015    renal failure requiring dialysis, rhabdomyolysis/notes 02/24/2015  . DKA (diabetic ketoacidoses) 01/2015  . Seizures     "related to her low blood sugars"/[name], sister-in-law 02/24/2015    Past Surgical  History  Procedure Laterality Date  . Tonsillectomy    . Abdominal hysterectomy    . Cesarean section         History of present illness and  Hospital Course:     Kindly see H&P for history of present illness and admission details, please review complete Labs, Consult reports and Test reports for all details in brief  HPI  from the history and physical done on the day of admission by Dr Tawanna Cooler Mcdiarmid on 02/25/2015  Acute Mental Status change - UDS positive for opiates and benzodiazepines. Kimberly Pena was discharged from hospitalist service with Vidodan #65 tabs on 02/16/15. There is no Benzodiazepines on patient's 9/15 discharge list nor her home med list at this admission.  - Pt alert, cooperative, oriented this morning. This sounds different from when patient was admitted.  Recent HPI chronology - Pt with DMT1 greater than 30 years. Patient has diabetic peripheral neuropathy.  - Recent prolonged hospitalization in [place] in August 2016 with DKA-related and Long-lie rhabdomyolysis-related Acute Kidney Injury requiring temporary hemodialysis - . Patient was started on Zoloft, Seroquel and Depakote  during this hospitalization for Bipolar Disorder during [facility] hospitalization. Patient has an initial appointment with Psychiatrist in [place]  The week of 02/27/15.  - Patient recently moved to [facility] to stay with brother and sister-in-law - Hospitalization [facility] 9/10 - 02/16/15 for DKA and Acute Kidney Injury. Her A1c was 7.8% on admission. Home medication list included Levimir 10 units qhs and Humalog 8 units three times daily. Patient discharged on Novulin N 8 units twice a day with instructions to stop Levimir and Humalog Kwikpen. Patient referred to [facility] pharmacy for medication dispensing.   - Initial outpatient follow-up with a PCP after [facility] 9/10 - 02/16/15 hospitalization with Julianne Handler FNP opn 02/21/15 where patient found to be symptomatically hypoglycemic. Pt  transferred to [facility] ED. After rescue by ED from hypoglycemia with oral and IV Dextrose, patient left ED against medical advice. About three hours later the patient re-presented with her sister-in-law accompaniment at [facility] Ed with hyperglycemia. ED physician assistant note states patient taking NovoLog either 8 to 10 units twice a day. PA noted her CBG diary shows patient has been checking her CBGs 6 to 7 times a day with CBGs between 20 to 400. The ED PA recommended Humalog 10 units twice a day and a Sliding Scale Insulin with Novolog. Kimberly Pena was discharged to the community just before midnight.  - 02/22/15 Kimberly Pena presented to [facility] ED with seizure-like activity with CBG 25 mg/dL after being found found by family when they returned home. Pt rescued by EMS with D50. Post-ictal patient was aggressive treated with Haldol 5 mg and Versed 5 mg. Patient's med list in ED included two different NPH insulins, and a regular insulin. With recovery of euglycemia patient's agitation resolved. She reported taking the SSI as instructed in the ED the day before. She denies missing meals.  - 02/24/15 ED presentation with mental status change of persveration and uncooperativeness which is a change from usual baseline behaviors of high-functioning. Sister-in-law reported that the patient's pill box still had the day's medications in it. Pt with psychomotor slowing and difficulty with maintaining stream of conversation.     Hospital Course   Acute encephalopathy -Related post ictal neurological insult following multiple hypoglycemic seizures. As well related to her severe episodes of hypoglycemia ,Head CT unremarkable. EEG negative for active seizures. MRI brain showing nonspecific white matter changes. - B1 level within normal limits and heavy metal screen is negative.Marland Kitchen HIV antibody and RPR negative. Urine drug screen positive for opiates and benzos only. Alcohol level was undetectable. TSH normal. - Avoid  narcotics or benzos.  -appreciate psych consult . Recommended to conitnue depakote and neurontin for mood stabilization. Continue Neurontin for peripheral neuropathy.  -Ammonia level normal. - Resolved.   Brittle type 1 diabetes. Has type 1 diabetes >30 years with peripheral neuropathy. He was hospitalized from 9/10-9/15 for DKA and acute kidney injury. A1c was 7.8. Her home insulin regimen includes Levemir 10 units at bedtime and Humalog 8 units 3 times a day.  -Recurrent episodes of hypoglycemic seizures prior to current admission (sister-in-law reports 5 witnessed seizure activity with fingersticks in the 20s) - Insulin regimen was adjusted during hospital stay, patient CBGs were poorly controlled as well with great variation between hypo-/hyperglycemia on insulin. NPH , so was transitioned to Lantus ,Lantus was gradually increased, to good CBG control on 24 units subcutaneous daily for the last 24 hours, as well with insulin sliding scale coverage, given patient brittle diabetes and significant hypoglycemia it is okay to tolerate  CBGs on the higher side. - Appointment has been arranged with endocrinologist Dr Everardo All on 03/08/2015 at 2 pm. - Contacted [facility] as per family request to evaluate for transfer and endocrinology evaluation as inpatient,[facility] has no beds available 9/28, and discussed again on 9/29 with internal medicine [facility] Dr. Theodoro Kalata, wiho see no indication for transfer at this point for endocrinology evaluation, as this can be done as an outpatient.   Alcohol abuse Reportedly issues last drink was 4 weeks back. No evidence of withdrawals  Diastolic CHF As per last echo EF of 50% with moderate mitral regurgitation. Continue Coreg. Clinically euvolemic.  Essential hypertension Continue Coreg, can be started on ACE inhibitor as an outpatient if her kidney function remains stable.  Acute kidney injury Resolved  Hypokalemia    Repleted,   Anemia - chronic disease. Iron panel and B12 normal. Anemia of chronic disease workup as an outpatient.     Discharge Condition:  Stable  Patient was seen by case management and social worker to arrange for support as an outpatient, she declined ALF placement due to cost. Spoke with father and sister in law over the phone about her anticipated discharge today . - Patient was evaluated by psychiatric day of discharge, she meets criteria for capacity to make her own medical decisions, and living arrangement. Follow UP  Follow-up Information    Follow up with [facility] On 03/02/2015.   Specialty:  Internal Medicine   Why:  11 am for hospital follow up-overflow for [facility] health and wellness clinic   Contact information:   [address] [place] [address] [phone]      Follow up with Romero Belling, MD On 03/08/2015.   Specialty:  Endocrinology   Why:  2 pm   Contact information:   [address] [place] [address] [phone]       Follow up with [facility] On 03/07/2015.   Specialty:  Internal Medicine   Why:  3 pm for hospital follow up, ( this is not a sickle cell visit this is a regular follow up)   Contact information:   [address] [place] [address] [phone]        Discharge Instructions  and  Discharge Medications     Discharge Instructions    Discharge instructions    Complete by:  As directed   Follow with PCP on scheduled appointment with sickle cell clinic on 03/07/2015. please follow with endocrinology on scheduled appointment 03/08/2015  Get CBC, CMP, 2 view Chest X ray checked  by Primary MD next visit.    Activity: As tolerated with Full fall precautions use walker/cane & assistance as needed   Disposition Home .   Diet: Heart Healthy, carbohydrate modified, patient was encouraged to eat snacks at bedtime .   On your next  visit with your primary care physician please Get Medicines reviewed and adjusted.   Please request your Prim.MD to go over all Hospital Tests and Procedure/Radiological results at the follow up, please get all Hospital records sent to your Prim MD by signing hospital release before you go home.   If you experience worsening of your admission symptoms, develop shortness of breath, life threatening emergency, suicidal or homicidal thoughts you must seek medical attention immediately by calling 911 or calling your MD immediately  if symptoms less severe.  You Must read complete instructions/literature  along with all the possible adverse reactions/side effects for all the Medicines you take and that have been prescribed to you. Take any new Medicines after you have completely understood and accpet all the possible adverse reactions/side effects.   Do not drive, operating heavy machinery, perform activities at heights, swimming or participation in water activities or provide baby sitting services if your were admitted for syncope or siezures until you have seen by Primary MD or a Neurologist and advised to do so again. Patient was notified of that.  Do not drive when taking Pain medications.    Do not take more than prescribed Pain, Sleep and Anxiety Medications  Special Instructions: If you have smoked or chewed Tobacco  in the last 2 yrs please stop smoking, stop any regular Alcohol  and or any Recreational drug use.  Wear Seat belts while driving.   Please note  You were cared for by a hospitalist during your hospital stay. If you have any questions about your discharge medications or the care you received while you were in the hospital after you are discharged, you can call the unit and asked to speak with the hospitalist on call if the hospitalist that took care of you is not available. Once you are discharged, your primary care physician will handle any further medical issues. Please note  that NO REFILLS for any discharge medications will be authorized once you are discharged, as it is imperative that you return to your primary care physician (or establish a relationship with a primary care physician if you do not have one) for your aftercare needs so that they can reassess your need for medications and monitor your lab values.     Increase activity slowly    Complete by:  As directed             Medication List    STOP taking these medications        amLODipine 5 MG tablet  Commonly known as:  NORVASC     HUMULIN N [place]     HUMULIN R IJ     HYDROcodone-acetaminophen 5-325 MG tablet  Commonly known as:  NORCO/VICODIN     QUEtiapine 50 MG tablet  Commonly known as:  SEROQUEL     sertraline 50 MG tablet  Commonly known as:  ZOLOFT      TAKE these medications        acetaminophen 325 MG tablet  Commonly known as:  TYLENOL  Take 2 tablets (650 mg total) by mouth every 6 (six) hours as needed for mild pain or fever.     aspirin EC 81 MG tablet  Take 81 mg by mouth daily.     carvedilol 6.25 MG tablet  Commonly known as:  COREG  Take 1 tablet (6.25 mg total) by mouth 2 (two) times daily with a meal.     divalproex 250 MG DR tablet  Commonly known as:  DEPAKOTE  Take 250 mg by mouth 2 (two) times daily.     gabapentin 100 MG capsule  Commonly known as:  NEURONTIN  Take 1 capsule (100 mg total) by mouth 3 (three) times daily.     insulin aspart 100 UNIT/ML injection  Commonly known as:  novoLOG  Inject 0-15 Units into the skin 3 (three) times daily with meals. Use of sliding scale - Fingersticks 70 - 120 use 0 units Fingersticks 121 -150 use 2 units Fingersticks 151- 200 use 3 units Fingersticks 201 - 250 use 5 units  Fingersticks 251-300 use 8 units Fingersticks 301- 350 use 11 units Fingersticks 351 - 400 use 15 units     insulin glargine 100 UNIT/ML injection  Commonly known as:  LANTUS  Inject 0.24 mLs (24 Units total) into the skin daily.           Diet and Activity recommendation: See Discharge Instructions above   Consults obtained -  Psychiatry neurology   Major procedures and Radiology Reports - PLEASE review detailed and final reports for all details, in brief -      Dg Chest 2 View  02/11/2015   CLINICAL DATA:  Diabetic ketoacidosis.  EXAM: CHEST  2 VIEW  COMPARISON:  None.  FINDINGS: The heart size and mediastinal contours are within normal limits. Both lungs are clear. No pneumothorax or pleural effusion is noted. The visualized skeletal structures are unremarkable.  IMPRESSION: No active cardiopulmonary disease.   Electronically Signed   By: Lupita Raider, M.D.   On: 02/11/2015 16:27   Ct Head Wo Contrast  02/24/2015   CLINICAL DATA:  Confusion, hyperglycemia, altered mental status.  EXAM: CT HEAD WITHOUT CONTRAST  TECHNIQUE: Contiguous axial images were obtained from the base of the skull through the vertex without intravenous contrast.  COMPARISON:  None.  FINDINGS: There is mild volume loss throughout the cerebral hemispheres, somewhat age advanced. No acute intracranial abnormality. Specifically, no hemorrhage, hydrocephalus, mass lesion, acute infarction, or significant intracranial injury. No acute calvarial abnormality. Visualized paranasal sinuses and mastoids clear. Orbital soft tissues unremarkable.  IMPRESSION: No acute intracranial abnormality.  Mild age advanced volume loss/ atrophy.   Electronically Signed   By: Charlett Nose M.D.   On: 02/24/2015 14:54   Mr Brain Wo Contrast  02/27/2015   CLINICAL DATA:  54 year old diabetic hypertensive female with alcohol abuse, seizure history and acute renal failure presenting with altered mental status. Subsequent encounter.  EXAM: MRI HEAD WITHOUT CONTRAST  TECHNIQUE: Multiplanar, multiecho pulse sequences of the brain and surrounding structures were obtained without intravenous contrast.  COMPARISON:  02/24/2015 CT.  No comparison MR.  FINDINGS: No acute infarct.   No intracranial hemorrhage.  Nonspecific white matter changes including symmetric altered signal intensity of the middle cerebral peduncle region. This may reflect result of small vessel disease or metabolic abnormality. No other findings to suggest intracranial infection.  Global atrophy without hydrocephalus.  Major intracranial vascular structures are patent.  No intracranial mass lesion noted on this unenhanced exam.  Cervical medullary junction, pituitary region, pineal region and orbital structures unremarkable.  Opacification posterior left ethmoid sinus air cell.  IMPRESSION: No acute infarct.  No intracranial hemorrhage.  Nonspecific white matter changes including symmetric altered signal intensity of the middle cerebral peduncle region. This may reflect result of small vessel disease or metabolic abnormality. No other findings to suggest intracranial infection.  Global atrophy without hydrocephalus.   Electronically Signed   By: Lacy Duverney M.D.   On: 02/27/2015 11:09   Portable Chest 1 View  02/25/2015   CLINICAL DATA:  Altered mental status.  EXAM: PORTABLE CHEST 1 VIEW  COMPARISON:  02/11/2015.  FINDINGS: Normal sized heart. Interval mildly elevated left hemidiaphragm. Clear lungs. Normal appearing bones.  IMPRESSION: Interval mild elevation of the left hemidiaphragm. Otherwise, unremarkable examination.   Electronically Signed   By: Beckie Salts M.D.   On: 02/25/2015 02:38    Micro Results     Recent Results (from the past 240 hour(s))  Culture, blood (routine x 2)  Status: None   Collection Time: 02/24/15  9:35 PM  Result Value Ref Range Status   Specimen Description BLOOD RIGHT ARM  Final   Special Requests BOTTLES DRAWN AEROBIC ONLY 10CC  Final   Culture NO GROWTH 5 DAYS  Final   Report Status 03/01/2015 FINAL  Final  Culture, blood (routine x 2)     Status: None   Collection Time: 02/24/15  9:43 PM  Result Value Ref Range Status   Specimen Description BLOOD RIGHT HAND   Final   Special Requests BOTTLES DRAWN AEROBIC ONLY 8CC  Final   Culture NO GROWTH 5 DAYS  Final   Report Status 03/01/2015 FINAL  Final       Today   Subjective:   Kimberly Pena today has no headache,no chest abdominal pain,no new weakness tingling or numbness.  Objective:   Blood pressure 106/55, pulse 71, temperature 98.4 F (36.9 C), temperature source Oral, resp. rate 16, height  (1.702 m), weight 68.04 kg (150 lb), SpO2 100 %.   Intake/Output Summary (Last 24 hours) at 03/02/15 1533 Last data filed at 03/02/15 0955  Gross per 24 hour  Intake   1080 ml  Output      0 ml  Net   1080 ml    Exam Awake Alert, Oriented x 3, No new F.N deficits, Normal affect [place].AT,PERRAL Supple Neck,No JVD, No cervical lymphadenopathy appriciated.  Symmetrical Chest wall movement, Good air movement bilaterally, CTAB RRR,No Gallops,Rubs or new Murmurs, No Parasternal Heave +ve B.Sounds, Abd Soft, Non tender, No organomegaly appriciated, No rebound -guarding or rigidity. No Cyanosis, Clubbing or edema, No new Rash or bruise  Data Review   CBC w Diff: Lab Results  Component Value Date   WBC 6.8 02/27/2015   HGB 8.9* 02/27/2015   HCT 26.9* 02/27/2015   PLT 262 02/27/2015   LYMPHOPCT 32 02/24/2015   MONOPCT 10 02/24/2015   EOSPCT 2 02/24/2015   BASOPCT 0 02/24/2015    CMP: Lab Results  Component Value Date   NA 143 02/27/2015   K 3.4* 02/27/2015   CL 107 02/27/2015   CO2 29 02/27/2015   BUN 7 02/27/2015   CREATININE 1.11* 02/27/2015   PROT 6.6 02/24/2015   ALBUMIN 3.3* 02/24/2015   BILITOT <0.1* 02/24/2015   ALKPHOS 85 02/24/2015   AST 24 02/24/2015   ALT 17 02/24/2015  .   Total Time in preparing paper work, data evaluation and todays exam - 35 minutes  ELGERGAWY, DAWOOD M.D on 03/02/2015 at 3:33 PM  [facility]   Office  [phone]

## 2015-03-03 NOTE — Progress Notes (Signed)
NCM called [facility] in [place]   [phone] and [facility] Endocrinology and osteoporosis center [phone] to try to get apt for patient for follow up, information faxed to them.  Also gave them patient's phone number for cell.  [phone].  They will look at the referral and get back with me.

## 2015-03-05 NOTE — Progress Notes (Signed)
This encounter was created in error - please disregard.

## 2015-03-06 ENCOUNTER — Encounter: Payer: Self-pay | Admitting: Cardiovascular Disease

## 2015-03-07 ENCOUNTER — Ambulatory Visit: Payer: Self-pay | Admitting: Family Medicine

## 2015-03-08 ENCOUNTER — Ambulatory Visit: Payer: Self-pay | Admitting: Endocrinology

## 2015-03-10 ENCOUNTER — Telehealth: Payer: Self-pay | Admitting: Endocrinology

## 2015-03-10 NOTE — Telephone Encounter (Signed)
Patient declined appt, lives in [place]

## 2015-03-10 NOTE — Telephone Encounter (Signed)
Patient no showed today's appt. Please advise on how to follow up. °A. No follow up necessary. °B. Follow up urgent. Contact patient immediately. °C. Follow up necessary. Contact patient and schedule visit in ___ days. °D. Follow up advised. Contact patient and schedule visit in ____weeks. ° °

## 2015-03-10 NOTE — Telephone Encounter (Signed)
No follow up necessary.  

## 2015-03-20 ENCOUNTER — Ambulatory Visit: Payer: Self-pay | Admitting: Endocrinology

## 2015-05-10 NOTE — Progress Notes (Signed)
NCM received call from [facility] Endocrinology at [facility] stating they have been trying to reach this patient to set up an appt but patient has not returned their calls, but if she calls in she an be seen.

## 2015-11-09 IMAGING — CT CT HEAD W/O CM
3 series · 17 of 30 positions shown, 19 images · non-contrast
Comparison: None.

CLINICAL DATA: Confusion, hyperglycemia, altered mental status.

EXAM:
CT HEAD WITHOUT CONTRAST
TECHNIQUE: Contiguous axial images were obtained from the base of the skull
through the vertex without intravenous contrast.

[Series 2: head without · axial · non-contrast · 0.42mm/px · z∈[+1474,+1564]mm · 4 of 31 slices shown]
[im 7/31  brain]
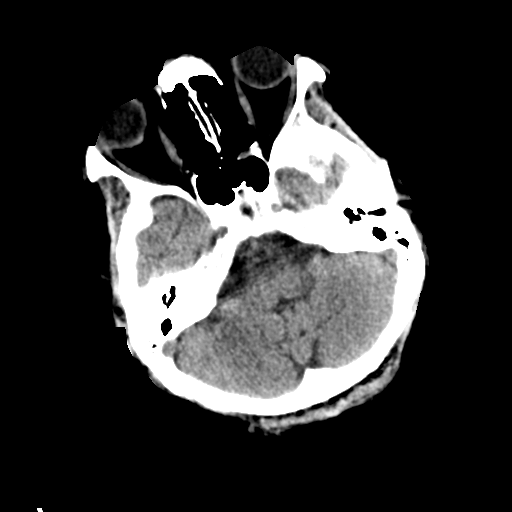
[im 13/31  brain]
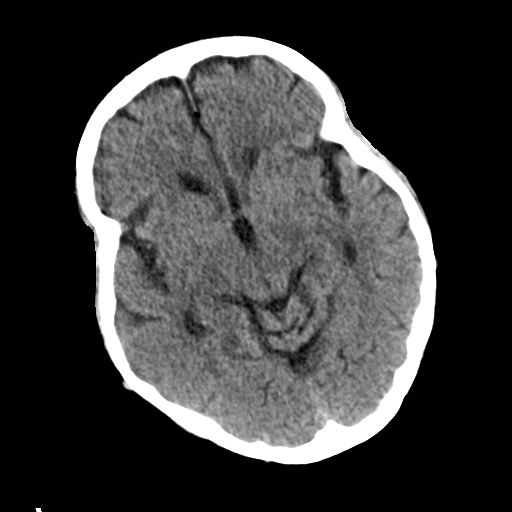
[im 19/31  brain]
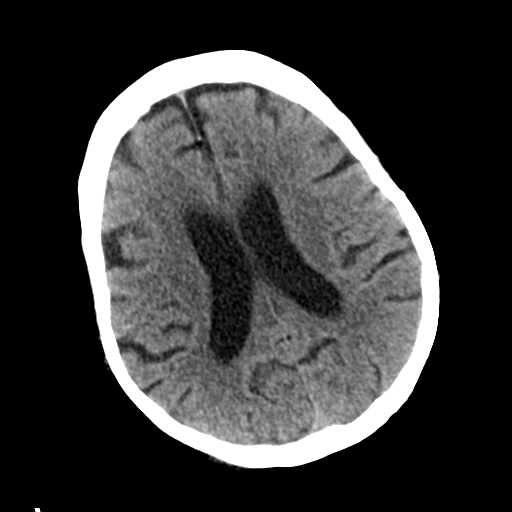
[im 25/31  brain]
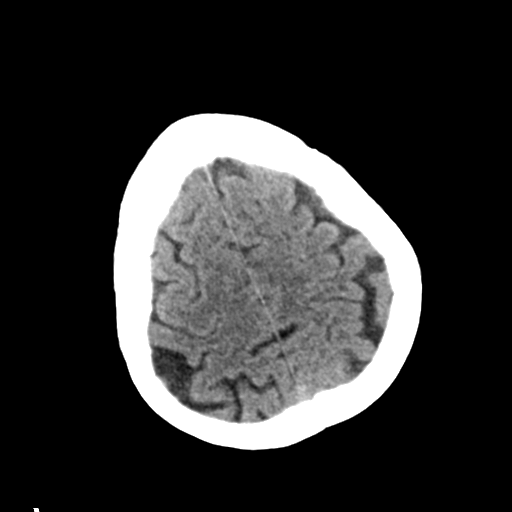

[Series 3: head bone · axial · 0.42mm/px · z∈[+1452,+1586]mm · 8 of 77 slices shown]
[im 5/77  bone]
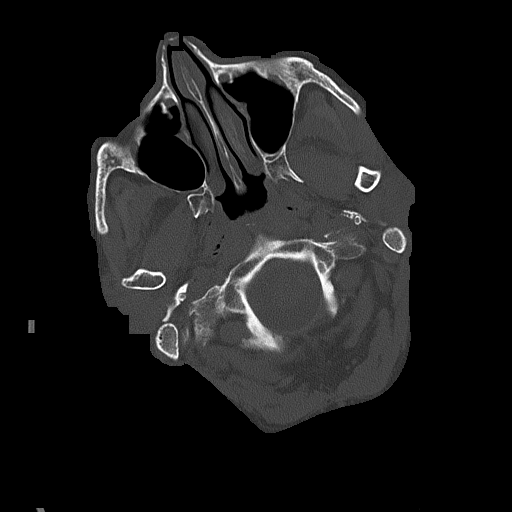
[im 15/77  bone]
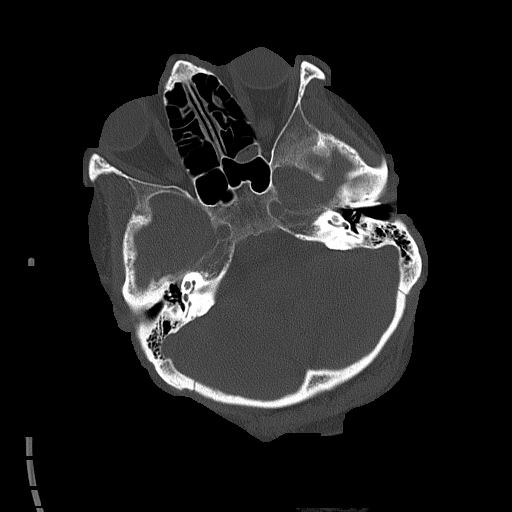
[im 24/77  bone]
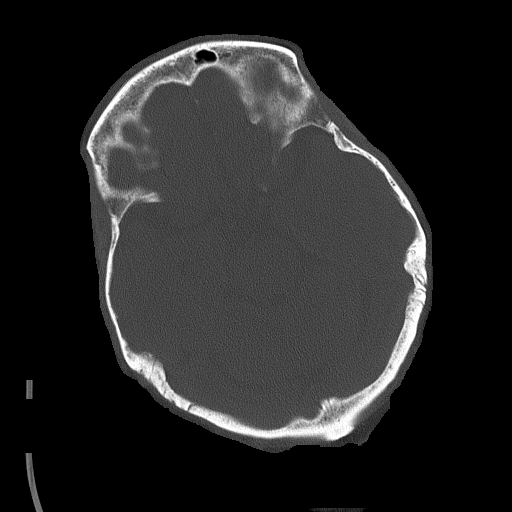
[im 34/77  bone]
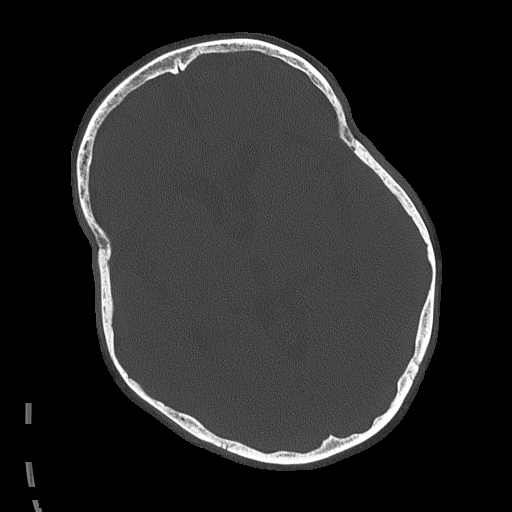
[im 43/77  bone]
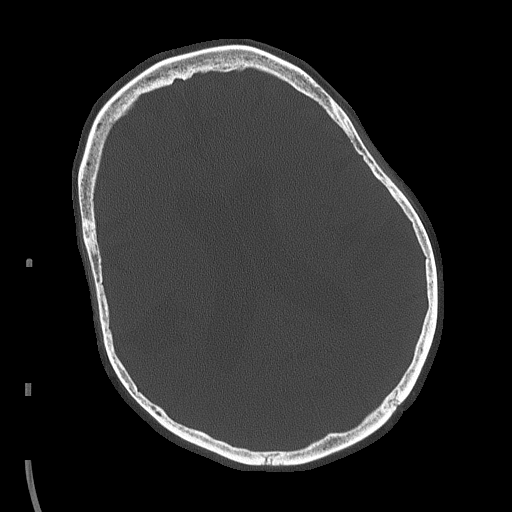
[im 53/77  bone]
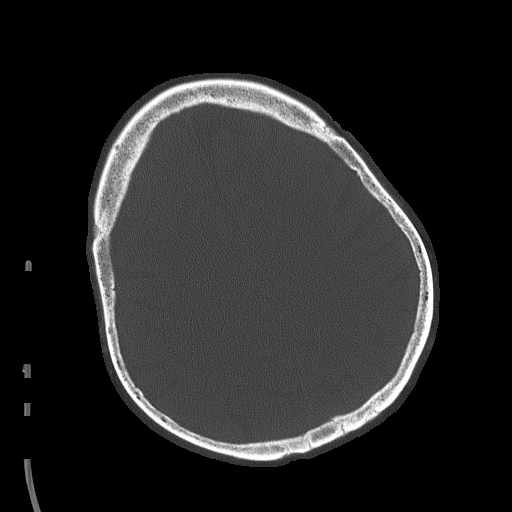
[im 62/77  bone]
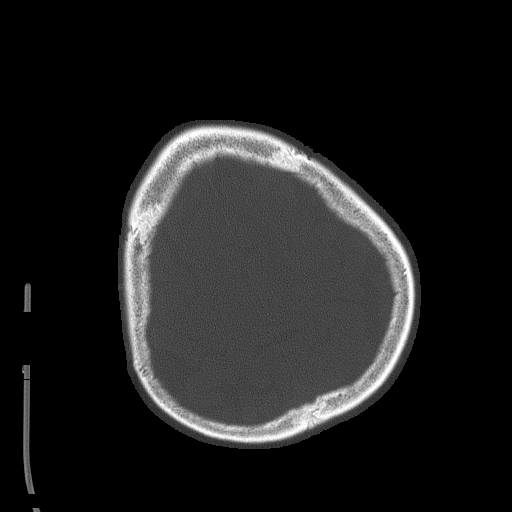
[im 72/77  bone]
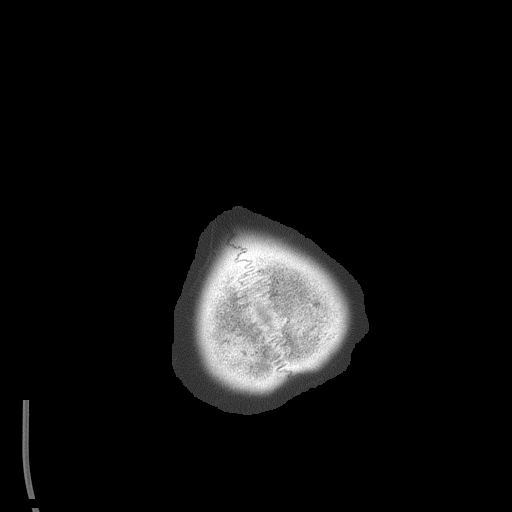

[Series 4: head without symmetry · axial · non-contrast · 0.42mm/px · z∈[+1460,+1559]mm · 5 of 30 slices shown, 7 images]
[im 5/30  brain]
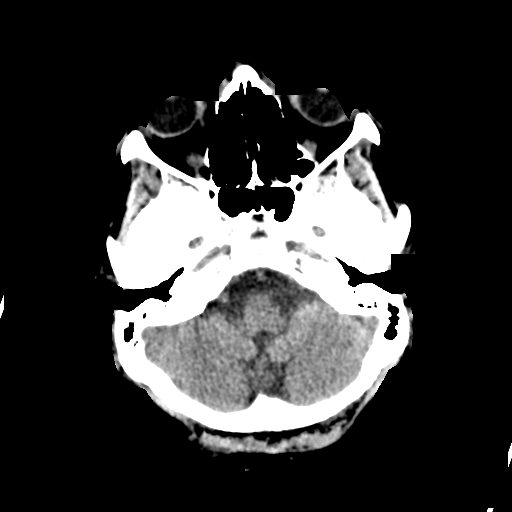
[im 5/30  bone]
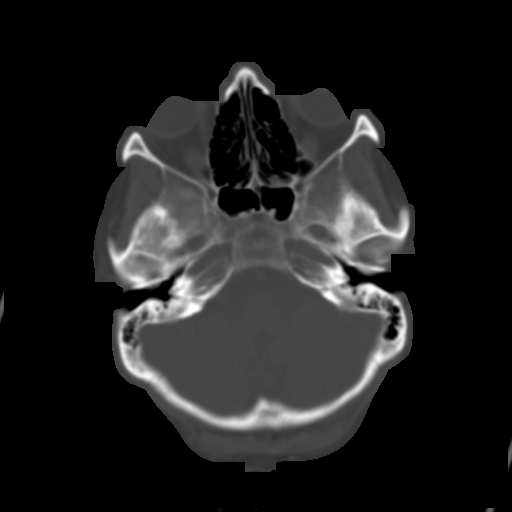
[im 10/30  brain]
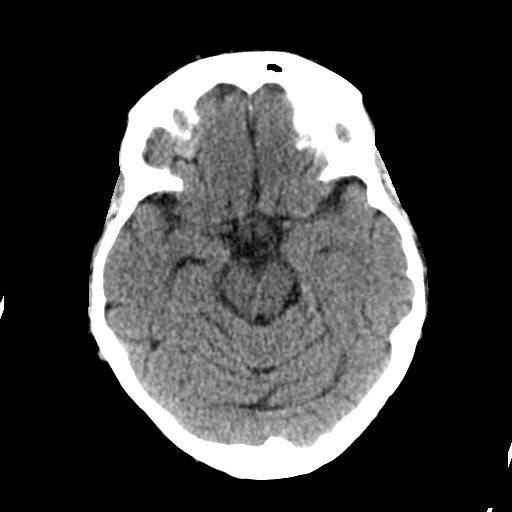
[im 15/30  brain]
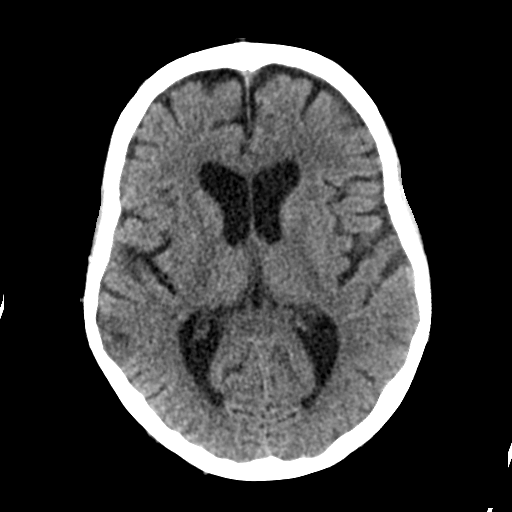
[im 20/30  brain]
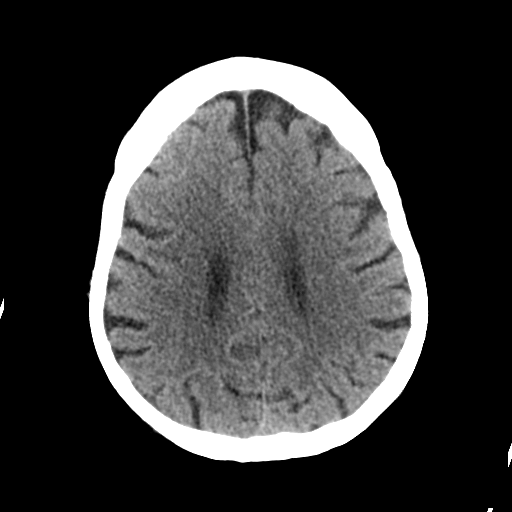
[im 25/30  brain]
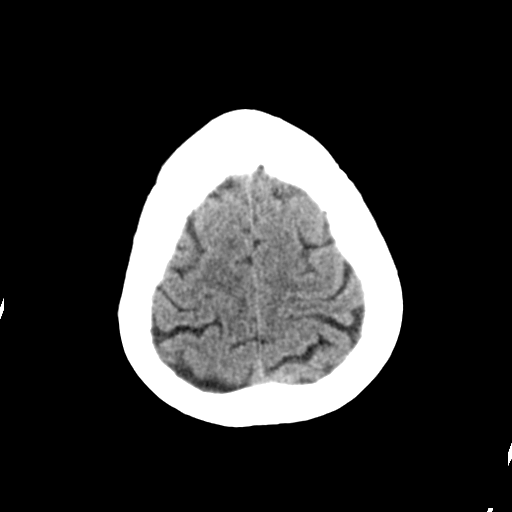
[im 25/30  bone]
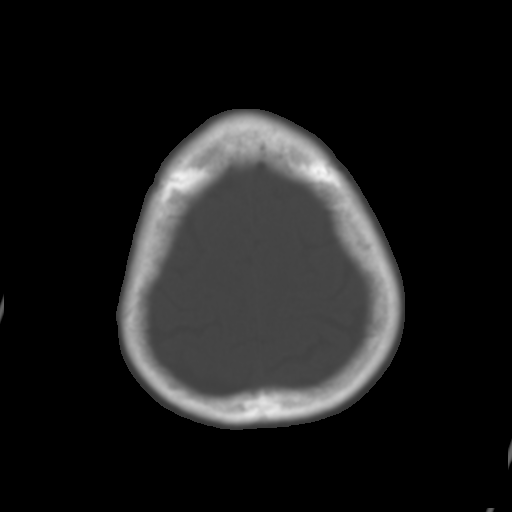

[17 of 30 positions shown; findings below may reference images not displayed]

FINDINGS: There is mild volume loss throughout the cerebral hemispheres,
somewhat age advanced. No acute intracranial abnormality.
Specifically, no hemorrhage, hydrocephalus, mass lesion, acute
infarction, or significant intracranial injury. No acute calvarial
abnormality. Visualized paranasal sinuses and mastoids clear.
Orbital soft tissues unremarkable.
IMPRESSION: No acute intracranial abnormality.

Mild age advanced volume loss/ atrophy.

## 2015-11-09 IMAGING — CR DG CHEST 1V PORT
1 series · 1 of 1 positions shown · non-contrast
Comparison: 02/11/2015.

CLINICAL DATA: Altered mental status.

EXAM:
PORTABLE CHEST 1 VIEW

[AP]
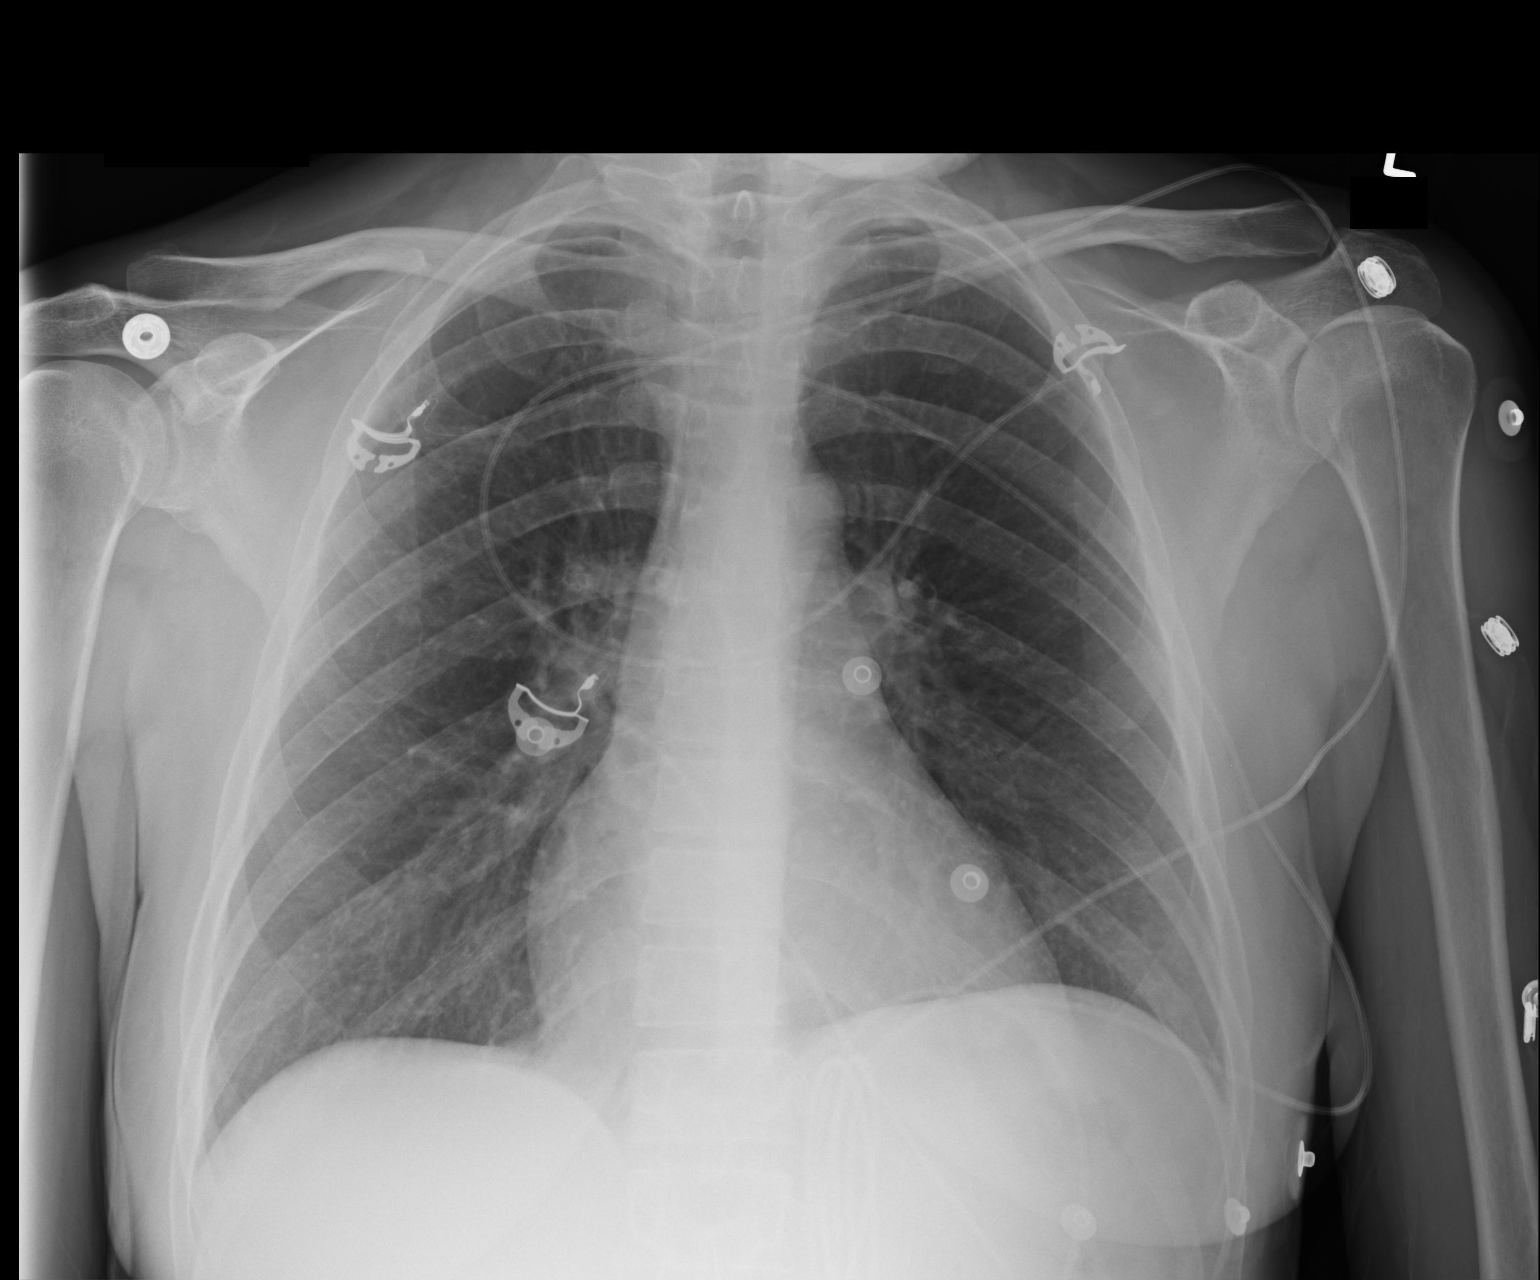

[1 of 1 positions shown; findings below may reference images not displayed]

FINDINGS: Normal sized heart. Interval mildly elevated left hemidiaphragm.
Clear lungs. Normal appearing bones.
IMPRESSION: Interval mild elevation of the left hemidiaphragm. Otherwise,
unremarkable examination.

## 2023-12-02 DEATH — deceased
